# Patient Record
Sex: Female | Born: 1955 | Race: White | Hispanic: No | Marital: Married | Smoking: Former smoker
Health system: Southern US, Community
[De-identification: ages and names within clinical notes are randomized; demographics above are authoritative.]

## PROBLEM LIST (undated history)

## (undated) DIAGNOSIS — D249 Benign neoplasm of unspecified breast: Secondary | ICD-10-CM

## (undated) DIAGNOSIS — Z973 Presence of spectacles and contact lenses: Secondary | ICD-10-CM

## (undated) DIAGNOSIS — K449 Diaphragmatic hernia without obstruction or gangrene: Secondary | ICD-10-CM

## (undated) DIAGNOSIS — Z83719 Family history of colon polyps, unspecified: Secondary | ICD-10-CM

## (undated) DIAGNOSIS — M1990 Unspecified osteoarthritis, unspecified site: Secondary | ICD-10-CM

## (undated) DIAGNOSIS — IMO0002 Reserved for concepts with insufficient information to code with codable children: Secondary | ICD-10-CM

## (undated) DIAGNOSIS — M542 Cervicalgia: Secondary | ICD-10-CM

## (undated) DIAGNOSIS — E611 Iron deficiency: Secondary | ICD-10-CM

## (undated) DIAGNOSIS — Z8371 Family history of colonic polyps: Secondary | ICD-10-CM

## (undated) DIAGNOSIS — K635 Polyp of colon: Secondary | ICD-10-CM

## (undated) DIAGNOSIS — B2790 Infectious mononucleosis, unspecified without complication: Secondary | ICD-10-CM

## (undated) DIAGNOSIS — K828 Other specified diseases of gallbladder: Secondary | ICD-10-CM

## (undated) DIAGNOSIS — K589 Irritable bowel syndrome without diarrhea: Secondary | ICD-10-CM

## (undated) DIAGNOSIS — K7689 Other specified diseases of liver: Secondary | ICD-10-CM

## (undated) DIAGNOSIS — E785 Hyperlipidemia, unspecified: Secondary | ICD-10-CM

## (undated) DIAGNOSIS — G4700 Insomnia, unspecified: Secondary | ICD-10-CM

## (undated) DIAGNOSIS — E041 Nontoxic single thyroid nodule: Secondary | ICD-10-CM

## (undated) HISTORY — DX: Cervicalgia: M54.2

## (undated) HISTORY — DX: Benign neoplasm of unspecified breast: D24.9

## (undated) HISTORY — DX: Irritable bowel syndrome without diarrhea: K58.9

## (undated) HISTORY — DX: Insomnia, unspecified: G47.00

## (undated) HISTORY — DX: Hyperlipidemia, unspecified: E78.5

## (undated) HISTORY — DX: Presence of spectacles and contact lenses: Z97.3

## (undated) HISTORY — DX: Reserved for concepts with insufficient information to code with codable children: IMO0002

## (undated) HISTORY — DX: Nontoxic single thyroid nodule: E04.1

## (undated) HISTORY — DX: Diaphragmatic hernia without obstruction or gangrene: K44.9

## (undated) HISTORY — DX: Other specified diseases of liver: K76.89

## (undated) HISTORY — DX: Family history of colon polyps, unspecified: Z83.719

## (undated) HISTORY — DX: Polyp of colon: K63.5

## (undated) HISTORY — DX: Iron deficiency: E61.1

## (undated) HISTORY — DX: Infectious mononucleosis, unspecified without complication: B27.90

## (undated) HISTORY — DX: Unspecified osteoarthritis, unspecified site: M19.90

## (undated) HISTORY — DX: Family history of colonic polyps: Z83.71

## (undated) HISTORY — DX: Other specified diseases of gallbladder: K82.8

---

## 1974-09-05 HISTORY — PX: OTHER SURGICAL HISTORY: SHX169

## 1998-03-03 ENCOUNTER — Ambulatory Visit (HOSPITAL_COMMUNITY): Admission: RE | Admit: 1998-03-03 | Discharge: 1998-03-03 | Payer: Self-pay | Admitting: Obstetrics and Gynecology

## 1998-08-04 ENCOUNTER — Ambulatory Visit (HOSPITAL_COMMUNITY): Admission: RE | Admit: 1998-08-04 | Discharge: 1998-08-04 | Payer: Self-pay | Admitting: Obstetrics and Gynecology

## 1998-08-04 ENCOUNTER — Encounter: Payer: Self-pay | Admitting: Obstetrics and Gynecology

## 1998-10-12 ENCOUNTER — Other Ambulatory Visit: Admission: RE | Admit: 1998-10-12 | Discharge: 1998-10-12 | Payer: Self-pay | Admitting: Obstetrics and Gynecology

## 1999-08-11 ENCOUNTER — Ambulatory Visit (HOSPITAL_COMMUNITY): Admission: RE | Admit: 1999-08-11 | Discharge: 1999-08-11 | Payer: Self-pay | Admitting: Obstetrics and Gynecology

## 1999-08-11 ENCOUNTER — Encounter: Payer: Self-pay | Admitting: Obstetrics and Gynecology

## 1999-10-15 ENCOUNTER — Other Ambulatory Visit: Admission: RE | Admit: 1999-10-15 | Discharge: 1999-10-15 | Payer: Self-pay | Admitting: Obstetrics and Gynecology

## 2000-08-18 ENCOUNTER — Ambulatory Visit (HOSPITAL_COMMUNITY): Admission: RE | Admit: 2000-08-18 | Discharge: 2000-08-18 | Payer: Self-pay | Admitting: Obstetrics and Gynecology

## 2000-08-18 ENCOUNTER — Encounter: Payer: Self-pay | Admitting: Obstetrics and Gynecology

## 2000-10-27 ENCOUNTER — Other Ambulatory Visit: Admission: RE | Admit: 2000-10-27 | Discharge: 2000-10-27 | Payer: Self-pay | Admitting: Obstetrics and Gynecology

## 2001-08-20 ENCOUNTER — Encounter: Admission: RE | Admit: 2001-08-20 | Discharge: 2001-08-20 | Payer: Self-pay | Admitting: *Deleted

## 2001-08-20 ENCOUNTER — Encounter: Payer: Self-pay | Admitting: *Deleted

## 2001-10-30 ENCOUNTER — Other Ambulatory Visit: Admission: RE | Admit: 2001-10-30 | Discharge: 2001-10-30 | Payer: Self-pay | Admitting: Obstetrics and Gynecology

## 2002-11-13 ENCOUNTER — Other Ambulatory Visit: Admission: RE | Admit: 2002-11-13 | Discharge: 2002-11-13 | Payer: Self-pay | Admitting: Obstetrics and Gynecology

## 2003-06-10 ENCOUNTER — Encounter: Admission: RE | Admit: 2003-06-10 | Discharge: 2003-09-08 | Payer: Self-pay | Admitting: Neurosurgery

## 2003-07-17 ENCOUNTER — Ambulatory Visit (HOSPITAL_COMMUNITY): Admission: RE | Admit: 2003-07-17 | Discharge: 2003-07-17 | Payer: Self-pay | Admitting: *Deleted

## 2003-07-17 ENCOUNTER — Encounter (INDEPENDENT_AMBULATORY_CARE_PROVIDER_SITE_OTHER): Payer: Self-pay | Admitting: Specialist

## 2003-11-06 ENCOUNTER — Ambulatory Visit (HOSPITAL_COMMUNITY): Admission: RE | Admit: 2003-11-06 | Discharge: 2003-11-06 | Payer: Self-pay | Admitting: *Deleted

## 2004-01-16 ENCOUNTER — Other Ambulatory Visit: Admission: RE | Admit: 2004-01-16 | Discharge: 2004-01-16 | Payer: Self-pay | Admitting: Obstetrics and Gynecology

## 2004-01-28 ENCOUNTER — Encounter: Admission: RE | Admit: 2004-01-28 | Discharge: 2004-01-28 | Payer: Self-pay | Admitting: Obstetrics and Gynecology

## 2004-06-09 ENCOUNTER — Ambulatory Visit (HOSPITAL_COMMUNITY): Admission: RE | Admit: 2004-06-09 | Discharge: 2004-06-09 | Payer: Self-pay | Admitting: Family Medicine

## 2004-07-21 ENCOUNTER — Ambulatory Visit (HOSPITAL_COMMUNITY): Admission: RE | Admit: 2004-07-21 | Discharge: 2004-07-21 | Payer: Self-pay | Admitting: *Deleted

## 2005-01-10 ENCOUNTER — Ambulatory Visit: Payer: Self-pay | Admitting: "Endocrinology

## 2005-01-13 ENCOUNTER — Ambulatory Visit (HOSPITAL_COMMUNITY): Admission: RE | Admit: 2005-01-13 | Discharge: 2005-01-13 | Payer: Self-pay | Admitting: "Endocrinology

## 2005-01-19 ENCOUNTER — Other Ambulatory Visit: Admission: RE | Admit: 2005-01-19 | Discharge: 2005-01-19 | Payer: Self-pay | Admitting: Obstetrics and Gynecology

## 2005-02-09 ENCOUNTER — Encounter: Admission: RE | Admit: 2005-02-09 | Discharge: 2005-02-09 | Payer: Self-pay | Admitting: "Endocrinology

## 2005-02-09 ENCOUNTER — Encounter (INDEPENDENT_AMBULATORY_CARE_PROVIDER_SITE_OTHER): Payer: Self-pay | Admitting: *Deleted

## 2005-02-09 ENCOUNTER — Other Ambulatory Visit: Admission: RE | Admit: 2005-02-09 | Discharge: 2005-02-09 | Payer: Self-pay | Admitting: Interventional Radiology

## 2005-10-28 ENCOUNTER — Encounter: Admission: RE | Admit: 2005-10-28 | Discharge: 2005-10-28 | Payer: Self-pay | Admitting: *Deleted

## 2005-11-17 ENCOUNTER — Ambulatory Visit: Payer: Self-pay | Admitting: "Endocrinology

## 2006-02-02 ENCOUNTER — Encounter: Admission: RE | Admit: 2006-02-02 | Discharge: 2006-02-02 | Payer: Self-pay | Admitting: Endocrinology

## 2006-04-10 ENCOUNTER — Encounter (INDEPENDENT_AMBULATORY_CARE_PROVIDER_SITE_OTHER): Payer: Self-pay | Admitting: Specialist

## 2006-04-10 ENCOUNTER — Other Ambulatory Visit: Admission: RE | Admit: 2006-04-10 | Discharge: 2006-04-10 | Payer: Self-pay | Admitting: Interventional Radiology

## 2006-04-10 ENCOUNTER — Encounter: Admission: RE | Admit: 2006-04-10 | Discharge: 2006-04-10 | Payer: Self-pay | Admitting: Surgery

## 2007-03-20 ENCOUNTER — Ambulatory Visit (HOSPITAL_COMMUNITY): Admission: RE | Admit: 2007-03-20 | Discharge: 2007-03-20 | Payer: Self-pay | Admitting: Obstetrics and Gynecology

## 2008-09-05 HISTORY — PX: ENDOMETRIAL ABLATION: SHX621

## 2008-09-20 ENCOUNTER — Emergency Department (HOSPITAL_BASED_OUTPATIENT_CLINIC_OR_DEPARTMENT_OTHER): Admission: EM | Admit: 2008-09-20 | Discharge: 2008-09-20 | Payer: Self-pay | Admitting: Emergency Medicine

## 2008-09-26 ENCOUNTER — Encounter (INDEPENDENT_AMBULATORY_CARE_PROVIDER_SITE_OTHER): Payer: Self-pay | Admitting: Obstetrics and Gynecology

## 2008-09-26 ENCOUNTER — Ambulatory Visit (HOSPITAL_COMMUNITY): Admission: RE | Admit: 2008-09-26 | Discharge: 2008-09-26 | Payer: Self-pay | Admitting: Obstetrics and Gynecology

## 2009-09-22 ENCOUNTER — Encounter: Admission: RE | Admit: 2009-09-22 | Discharge: 2009-09-22 | Payer: Self-pay | Admitting: Neurosurgery

## 2009-11-03 ENCOUNTER — Encounter: Admission: RE | Admit: 2009-11-03 | Discharge: 2009-12-11 | Payer: Self-pay | Admitting: Neurology

## 2010-05-07 LAB — HM DEXA SCAN

## 2010-08-17 ENCOUNTER — Encounter
Admission: RE | Admit: 2010-08-17 | Discharge: 2010-08-17 | Payer: Self-pay | Source: Home / Self Care | Attending: Gastroenterology | Admitting: Gastroenterology

## 2010-09-03 ENCOUNTER — Ambulatory Visit (HOSPITAL_COMMUNITY)
Admission: RE | Admit: 2010-09-03 | Discharge: 2010-09-03 | Payer: Self-pay | Source: Home / Self Care | Attending: Gastroenterology | Admitting: Gastroenterology

## 2010-09-26 ENCOUNTER — Encounter: Payer: Self-pay | Admitting: *Deleted

## 2010-12-20 LAB — CBC
Hemoglobin: 11.8 g/dL — ABNORMAL LOW (ref 12.0–15.0)
RBC: 3.98 MIL/uL (ref 3.87–5.11)
WBC: 8.4 10*3/uL (ref 4.0–10.5)

## 2011-01-14 ENCOUNTER — Encounter (INDEPENDENT_AMBULATORY_CARE_PROVIDER_SITE_OTHER): Payer: Self-pay | Admitting: Surgery

## 2011-01-18 NOTE — Op Note (Signed)
NAMEKEYONDRA, LAGRAND               ACCOUNT NO.:  [id number]   MEDICAL RECORD NO.:  [id number]          PATIENT TYPE:  AMB   LOCATION:  SDC                           FACILITY:  [facility]   PHYSICIAN:  Duke Salvia. Marcelle Overlie, M.D.DATE OF BIRTH:  21-Jul-1956   DATE OF PROCEDURE:  DATE OF DISCHARGE:  09/20/2008                               OPERATIVE REPORT   PREOPERATIVE DIAGNOSIS:  Abnormal uterine bleeding.   POSTOPERATIVE DIAGNOSIS:  Abnormal uterine bleeding.   PROCEDURE:  D and C, hysteroscopy, NovaSure EMA.   SURGEON:  Duke Salvia. Marcelle Overlie, MD   ANESTHESIA:  Sedation plus paracervical block.   SPECIMENS REMOVED:  Endometrial curettings.   COMPLICATIONS:  None.   ESTIMATED BLOOD LOSS:  Minimal.   PROCEDURE AND FINDINGS:  The patient was taken to the operative room  after an adequate level of sedation was obtained with the patient's legs  in stirrups.  Perineum and vagina were prepped and draped in the usual  manner for D and C.  The bladder was drained.  EUA carried out.  Uterus  upper limits of normal size, mid position, adnexa negative.  A weighted  speculum was positioned.  Cervix grasped with a tenaculum.  Paracervical  block created by infiltrating at 2, 10, 8, 4 o'clock, 5-7 mL of 1%  Xylocaine at each site after negative aspiration.  The uterus sounded to  10 cm, progressively dilated to a 32 Pratt dilator.  Uterus sounded to  10.5 cm.  Cervical length 3.5 cm.  A 7-mm continuous flow hysteroscope  was then used to perform hysteroscopy revealing there to be some shaggy  endometrium, but no polyps, no other abnormalities noted.  Sharp  curettage was carried out and sent for pathology.  The NovaSure  instrument was then engaged with a 6.5 depth setting and the width  measurement of 4.5.  Initially, could not get the CO2 test to pass even  with a good seal in cervix.  Instrument was removed.  Hysteroscopy was  carried out.  There was no evidence of any bleeding or concern for  perforation.  At that time, it was discovered that the lower lock vacuum  on the lower end of the instrument was not sealed appropriately and some  bloody fluid had actually suctioned into the device itself indicating an  improper vacuum seal.  That instrument was removed and new instrument  was utilized with the appropriate settings.  We checked the vacuum  settings on that this instrument prior to remeasuring the cervical width  which was still 6.5.  At that point, the CO2 test was passed quickly.  Endometrial  ablation was then carried out and was well tolerated for power setting  of 143.  The time was not listed by the circulator.  This was well  tolerated with minimal bleeding.  She went to recovery room in good  condition.  Did receive Toradol both IV and IM.      Richard M. Marcelle Overlie, M.D.  Electronically Signed     RMH/MEDQ  D:  09/26/2008  T:  09/27/2008  Job:  [id number]

## 2011-01-18 NOTE — H&P (Signed)
[name], [name]               ACCOUNT NO.:  [id number]   MEDICAL RECORD NO.:  [id number]          PATIENT TYPE:  AMB   LOCATION:                                FACILITY:  [facility]   PHYSICIAN:  Duke Salvia. Marcelle [name], M.D.DATE OF BIRTH:  1955-10-26   DATE OF ADMISSION:  09/26/2008  DATE OF DISCHARGE:                              HISTORY & PHYSICAL   DATE OF SURGERY:  September 11, 2008.   CHIEF COMPLAINT:  AUB.   HISTORY OF PRESENT ILLNESS:  A 55 year old perimenopausal female.  Due  to irregular bleeding in December 2008, she had Crystal [name] and endometrial  biopsy.  Endometrial biopsy was normal.  Ultrasound showed possible  adenomyosis.  Due to continued irregular bleeding, she has been started  on OCPs with variable results, presents now for D and C, hysteroscopy,  and NovaSure EMA due to continued problematic bleeding.  This procedure  including risks related to bleeding, infection, adjacent organ injury,  the possible need for open additional surgery, all reviewed with her  which she understands and accepts.   CURRENT MEDICATIONS:  1. Femcon b.i.d.  2. Simvastatin 20 mg once daily.  3. Lorazepam 0.5 p.r.n.   PAST SURGICAL HISTORY:  Cesarean section x2.   FAMILY HISTORY:  Significant for kidney disease, arthritis, diabetes,  hypertension, and colon cancer.   Her medical doctor is Crystal Martin, MD   PHYSICAL EXAMINATION:  VITAL SIGNS:  Temperature 98.2, blood pressure  120/82.  HEENT:  Unremarkable.  NECK:  Supple without masses.  LUNGS:  Clear.  CARDIOVASCULAR:  Regular rate and rhythm without murmurs, rubs, or  gallops.  BREASTS:  Without masses.  ABDOMEN:  Soft, flat, nontender.  PELVIC:  Normal external genitalia.  High vaginal swab is clear.  Uterus  upper limit of normal size.  Adnexa negative.  EXTREMITIES:  Unremarkable.  NEUROLOGIC:  Unremarkable.   IMPRESSION:  1. Abnormal perimenopausal bleeding, unresponsive to conservative      measures.  2.  Adenomyosis.   PLAN:  D and C, hysteroscopy with NovaSure EMA.  Procedure and risks  reviewed as above up.      Crystal [name], M.D.  Electronically Signed     RMH/MEDQ  D:  09/08/2008  T:  09/09/2008  Job:  [id number]

## 2011-03-16 ENCOUNTER — Other Ambulatory Visit (INDEPENDENT_AMBULATORY_CARE_PROVIDER_SITE_OTHER): Payer: Self-pay | Admitting: Surgery

## 2011-03-16 DIAGNOSIS — E041 Nontoxic single thyroid nodule: Secondary | ICD-10-CM

## 2011-03-25 ENCOUNTER — Ambulatory Visit
Admission: RE | Admit: 2011-03-25 | Discharge: 2011-03-25 | Disposition: A | Discharge status: Home / Self Care | Payer: 59 | Source: Ambulatory Visit | Attending: Surgery | Admitting: Surgery

## 2011-03-25 DIAGNOSIS — E041 Nontoxic single thyroid nodule: Secondary | ICD-10-CM

## 2011-05-02 ENCOUNTER — Encounter (INDEPENDENT_AMBULATORY_CARE_PROVIDER_SITE_OTHER): Payer: Self-pay | Admitting: Surgery

## 2011-05-03 ENCOUNTER — Encounter (INDEPENDENT_AMBULATORY_CARE_PROVIDER_SITE_OTHER): Payer: Self-pay | Admitting: Surgery

## 2011-05-03 ENCOUNTER — Ambulatory Visit (INDEPENDENT_AMBULATORY_CARE_PROVIDER_SITE_OTHER): Payer: 59 | Admitting: Surgery

## 2011-05-03 VITALS — BP 100/70 | Temp 97.6°F | Ht 63.0 in | Wt 138.0 lb

## 2011-05-03 DIAGNOSIS — E041 Nontoxic single thyroid nodule: Secondary | ICD-10-CM | POA: Insufficient documentation

## 2011-05-03 NOTE — Progress Notes (Signed)
Visit Diagnoses: 1. Benign thyroid cyst     HISTORY: The patient is a 55 year old female followed in my practice for several years with a multi-septated cyst involving the lower pole of the right thyroid lobe. This is been asymptomatic. He has been stable on sequential ultrasound scanning. Ultrasound was performed this month at [facility]. It shows minimal change in the character and size of the cyst since her examination in 2007. Remainder of the thyroid gland is without focal lesion.   PERTINENT REVIEW OF SYSTEMS: Patient continues to note a slightly garbled quality to her voice. This has been stable over many years. She denies any compressive symptoms. She denies a globus sensation. She denies any dysphagia.   EXAM: HEENT: normocephalic; pupils equal and reactive; sclerae clear; dentition good; mucous membranes moist NECK:  symmetric on extension; no palpable anterior or posterior cervical lymphadenopathy; no supraclavicular masses; no tenderness CHEST: clear to auscultation bilaterally without rales, rhonchi, or wheezes CARDIAC: regular rate and rhythm without significant murmur; peripheral pulses are full EXT:  non-tender without edema; no deformity NEURO: no gross focal deficits; no sign of tremor   IMPRESSION: 3.9 cm septated cyst in the inferior pole right thyroid lobe, asymptomatic   PLAN: The patient and I discussed the above findings. At this point she is asymptomatic. The overall size and character of the lesion is stable over 5 years. I think it is safe to continue to monitor this. We will repeat her ultrasound in 2 years and see her in the office at that time.  We will obtain her recent TSH level from the office of her gynecologist.   Velora Heckler, MD, FACS General & Endocrine Surgery [facility]

## 2011-05-08 LAB — HM COLONOSCOPY

## 2011-09-28 ENCOUNTER — Encounter: Payer: Self-pay | Admitting: Internal Medicine

## 2011-09-30 ENCOUNTER — Encounter: Payer: Self-pay | Admitting: Internal Medicine

## 2011-10-06 ENCOUNTER — Ambulatory Visit: Payer: 59 | Admitting: Internal Medicine

## 2011-11-16 ENCOUNTER — Encounter (INDEPENDENT_AMBULATORY_CARE_PROVIDER_SITE_OTHER): Payer: 59 | Admitting: Surgery

## 2011-11-18 ENCOUNTER — Encounter (INDEPENDENT_AMBULATORY_CARE_PROVIDER_SITE_OTHER): Payer: Self-pay | Admitting: Surgery

## 2011-11-18 ENCOUNTER — Telehealth (INDEPENDENT_AMBULATORY_CARE_PROVIDER_SITE_OTHER): Payer: Self-pay | Admitting: Surgery

## 2011-11-18 ENCOUNTER — Ambulatory Visit (INDEPENDENT_AMBULATORY_CARE_PROVIDER_SITE_OTHER): Payer: 59 | Admitting: Surgery

## 2011-11-18 VITALS — BP 104/76 | HR 60 | Temp 98.6°F | Resp 16 | Ht 63.5 in | Wt 141.0 lb

## 2011-11-18 DIAGNOSIS — K828 Other specified diseases of gallbladder: Secondary | ICD-10-CM | POA: Insufficient documentation

## 2011-11-18 NOTE — Patient Instructions (Signed)
[facility] LAPAROSCOPIC SURGERY: POST OP INSTRUCTIONS  Always review your discharge instruction sheet given to you by the facility where your surgery was performed.  1. A prescription for pain medication may be given to you upon discharge.  Take your pain medication as prescribed, if needed.  If narcotic pain medicine is not needed, then you may take acetaminophen (Tylenol) or ibuprofen (Advil) as needed. 2. Take your usually prescribed medications unless otherwise directed. 3. If you need a refill on your pain medication, please contact your pharmacy.  They will contact our office to request authorization. Prescriptions will not be filled after 5pm or on week-ends. 4. You should follow a light diet the first few days after arrival home, such as soup and crackers, etc.  Be sure to include lots of fluids daily. 5. Most patients will experience some swelling and bruising in the area of the incisions.  Ice packs will help.  Swelling and bruising can take several days to resolve.  6. It is common to experience some constipation if taking pain medication after surgery.  Increasing fluid intake and taking a stool softener (such as Colace) will usually help or prevent this problem from occurring.  A mild laxative (Milk of Magnesia or Miralax) should be taken according to package instructions if there are no bowel movements after 48 hours. 7. Unless discharge instructions indicate otherwise, you may remove your bandages 24-48 hours after surgery, and you may shower at that time.  You may have steri-strips (small skin tapes) in place directly over the incision.  These strips should be left on the skin for 7-10 days.  If your surgeon used skin glue on the incision, you may shower in 24 hours.  The glue will flake off over the next 2-3 weeks.  Any sutures or staples will be removed at the office during your follow-up visit. 8. ACTIVITIES:  You may resume regular (light) daily activities  beginning the next day-such as daily self-care, walking, climbing stairs-gradually increasing activities as tolerated.  You may have sexual intercourse when it is comfortable.  Refrain from any heavy lifting or straining until approved by your doctor. 9. You may drive when you are no longer taking prescription pain medication, you can comfortably wear a seatbelt, and you can safely maneuver your car and apply brakes. 10. You should see your doctor in the office for a follow-up appointment approximately 2-3 weeks after your surgery.  Make sure that you call for this appointment within a day or two after you arrive home to insure a convenient appointment time.  WHEN TO CALL YOUR DOCTOR: 1. Fever over 101.0 2. Inability to urinate 3. Continued bleeding from incision. 4. Increased pain, redness, or drainage from the incision. 5. Increasing abdominal pain  The clinic staff is available to answer your questions during regular business hours.  Please don't hesitate to call and ask to speak to one of the nurses for clinical concerns.  If you have a medical emergency, go to the nearest emergency room or call 911.  A surgeon from [facility] is always on call at the hospital. [phone] ? [phone] ? FAX [phone] Web site: [email or web address]  

## 2011-11-18 NOTE — Progress Notes (Signed)
Chief Complaint  Patient presents with  . Abdominal Pain    history of biliary dyskinesia - referral by Dr. Marisue Brooklyn    HISTORY: Patient is a 56 year old white female well-known to my surgical practice. Patient has been previously evaluated for abdominal pain and history of biliary dyskinesia. Patient has also been followed for a thyroid cyst.  Patient has undergone additional testing including colonoscopy, upper endoscopy, and recent laboratory studies. She continues to have right upper quadrant abdominal discomfort radiating to the back. This is exacerbated by food intake. She denies fevers or chills. She denies nausea or vomiting. She does have loose bowel movements. She has a history of biliary dyskinesia with a low gallbladder ejection fraction on nuclear medicine scanning. Patient presents today to again discuss cholecystectomy.  Past Medical History  Diagnosis Date  . IBS (irritable bowel syndrome)   . Colon polyp   . Hyperlipidemia   . Wears glasses   . Abdominal pain   . Cyst     right  thyroid  . Insomnia   . Neck pain   . Hemorrhoids   . Mononucleosis   . Migraine   . Thyroid nodule   . Iron deficiency   . Arthritis   . Liver cyst   . FH: colonic polyps   . Hiatal hernia   . Biliary dyskinesia   . Fibroadenoma      Current Outpatient Prescriptions  Medication Sig Dispense Refill  . LORazepam (ATIVAN) 0.5 MG tablet Take 0.5 mg by mouth every 8 (eight) hours.           No Known Allergies   Family History  Problem Relation Age of Onset  . Hypertension Mother   . Kidney disease Mother   . Hypertension Father   . Cancer Father     colon  . Diabetes Father      History   Social History  . Marital Status: Married    Spouse Name: N/A    Number of Children: N/A  . Years of Education: N/A   Social History Main Topics  . Smoking status: Former Games developer  . Smokeless tobacco: None   Comment: 8yrs ago quit  . Alcohol Use: No  . Drug Use: No  .  Sexually Active: None   Other Topics Concern  . None   Social History Narrative  . None     REVIEW OF SYSTEMS - PERTINENT POSITIVES ONLY: Denies jaundice. Denies acholic stools. Denies fever. Denies chills. Denies nausea. Denies emesis.  EXAM: Filed Vitals:   11/18/11 0913  BP: 104/76  Pulse: 60  Temp: 98.6 F (37 C)  Resp: 16    HEENT: normocephalic; pupils equal and reactive; sclerae clear; dentition good; mucous membranes moist NECK:  symmetric on extension; no palpable anterior or posterior cervical lymphadenopathy; no supraclavicular masses; no tenderness CHEST: clear to auscultation bilaterally without rales, rhonchi, or wheezes CARDIAC: regular rate and rhythm without significant murmur; peripheral pulses are full ABDOMEN: soft without distension; bowel sounds present; no mass; no hepatosplenomegaly; no hernia EXT:  non-tender without edema; no deformity NEURO: no gross focal deficits; no sign of tremor   LABORATORY RESULTS: See [facility] (CHL-Epic) for most recent results   RADIOLOGY RESULTS: See [facility] (CHL-Epic) for most recent results   IMPRESSION: Persistent right upper quadrant abdominal pain with radiation to the back, history of borderline biliary dyskinesia  PLAN: The patient and I again discussed cholecystectomy in hopes of improving her right upper quadrant abdominal symptoms. She understands  that there is no guarantee of relief. We discussed the procedure of laparoscopic cholecystectomy. We discussed the possibility of conversion to open surgery. We discussed the hospital stay in the postoperative recovery period.  We will perform intraoperative cholangiography to evaluate the bile ducts. Patient is anxious to proceed with surgery in the near future.  The risks and benefits of the procedure have been discussed at length with the patient.  The patient understands the proposed procedure, potential alternative treatments, and the course of  recovery to be expected.  All of the patient's questions have been answered at this time.  The patient wishes to proceed with surgery and will schedule a date for the procedure through our office staff.   Velora Heckler, MD, FACS General & Endocrine Surgery [facility]   Visit Diagnoses: 1. Biliary dyskinesia     Primary Care Physician: Doreatha Martin, MD, MD  GI:  Dr. Marisue Brooklyn

## 2011-11-25 ENCOUNTER — Other Ambulatory Visit (INDEPENDENT_AMBULATORY_CARE_PROVIDER_SITE_OTHER): Payer: Self-pay | Admitting: Surgery

## 2011-11-25 DIAGNOSIS — K811 Chronic cholecystitis: Secondary | ICD-10-CM

## 2011-12-01 HISTORY — PX: CHOLECYSTECTOMY: SHX55

## 2011-12-09 ENCOUNTER — Encounter (INDEPENDENT_AMBULATORY_CARE_PROVIDER_SITE_OTHER): Payer: Self-pay | Admitting: Surgery

## 2011-12-16 ENCOUNTER — Ambulatory Visit (INDEPENDENT_AMBULATORY_CARE_PROVIDER_SITE_OTHER): Payer: 59 | Admitting: Surgery

## 2011-12-22 ENCOUNTER — Encounter (INDEPENDENT_AMBULATORY_CARE_PROVIDER_SITE_OTHER): Payer: Self-pay | Admitting: Surgery

## 2011-12-22 ENCOUNTER — Encounter (INDEPENDENT_AMBULATORY_CARE_PROVIDER_SITE_OTHER): Payer: Self-pay

## 2011-12-22 ENCOUNTER — Ambulatory Visit (INDEPENDENT_AMBULATORY_CARE_PROVIDER_SITE_OTHER): Payer: 59 | Admitting: Surgery

## 2011-12-22 VITALS — BP 106/70 | HR 68 | Temp 98.2°F | Resp 16 | Ht 63.5 in | Wt 134.6 lb

## 2011-12-22 DIAGNOSIS — K828 Other specified diseases of gallbladder: Secondary | ICD-10-CM

## 2011-12-22 NOTE — Patient Instructions (Signed)
  COCOA BUTTER & VITAMIN E CREAM  ([employer] or other brand)  Apply cocoa butter/vitamin E cream to your incision 2 - 3 times daily.  Massage cream into incision for one minute with each application.  Use sunscreen (50 SPF or higher) for first 6 months after surgery if area is exposed to sun.  You may substitute Mederma or other scar reducing creams as desired.   

## 2011-12-22 NOTE — Progress Notes (Signed)
Visit Diagnoses: 1. Biliary dyskinesia     HISTORY: The patient is a 56 year old white female who underwent laparoscopic cholecystectomy for biliary dyskinesia. Final pathology shows chronic cholecystitis.  EXAM: Abdomen is soft and nontender. Surgical wounds are well healed. No sign of herniation. No sign of infection. Right upper quadrant is soft and nontender without mass.  IMPRESSION: Status post laparoscopic cholecystectomy for biliary dyskinesia  PLAN: Patient is released to full activity without restriction. She will apply topical creams or incisions. I have given her a copy of her pathology report.  Patient will return for followup in 2 years regarding her thyroid cyst.  Velora Heckler, MD, FACS General & Endocrine Surgery [facility]

## 2012-02-03 ENCOUNTER — Other Ambulatory Visit (HOSPITAL_COMMUNITY): Payer: Self-pay | Admitting: Obstetrics and Gynecology

## 2012-02-03 DIAGNOSIS — R1011 Right upper quadrant pain: Secondary | ICD-10-CM

## 2012-02-03 DIAGNOSIS — R109 Unspecified abdominal pain: Secondary | ICD-10-CM

## 2012-02-07 ENCOUNTER — Ambulatory Visit (HOSPITAL_COMMUNITY): Admission: RE | Admit: 2012-02-07 | Payer: 59 | Source: Ambulatory Visit

## 2012-02-08 ENCOUNTER — Ambulatory Visit (HOSPITAL_COMMUNITY)
Admission: RE | Admit: 2012-02-08 | Discharge: 2012-02-08 | Disposition: A | Discharge status: Home / Self Care | Payer: 59 | Source: Ambulatory Visit | Attending: Obstetrics and Gynecology | Admitting: Obstetrics and Gynecology

## 2012-02-08 DIAGNOSIS — R109 Unspecified abdominal pain: Secondary | ICD-10-CM

## 2012-02-08 DIAGNOSIS — D1809 Hemangioma of other sites: Secondary | ICD-10-CM | POA: Insufficient documentation

## 2012-02-08 DIAGNOSIS — R1011 Right upper quadrant pain: Secondary | ICD-10-CM

## 2012-02-08 DIAGNOSIS — K7689 Other specified diseases of liver: Secondary | ICD-10-CM | POA: Insufficient documentation

## 2012-02-08 MED ORDER — IOHEXOL 300 MG/ML  SOLN: 100.0000 mL | Freq: Once | INTRAMUSCULAR | Status: AC | PRN

## 2012-03-22 ENCOUNTER — Encounter (INDEPENDENT_AMBULATORY_CARE_PROVIDER_SITE_OTHER): Payer: Self-pay

## 2012-10-16 ENCOUNTER — Ambulatory Visit (INDEPENDENT_AMBULATORY_CARE_PROVIDER_SITE_OTHER): Payer: 59 | Admitting: Surgery

## 2012-10-16 ENCOUNTER — Encounter (INDEPENDENT_AMBULATORY_CARE_PROVIDER_SITE_OTHER): Payer: Self-pay | Admitting: Surgery

## 2012-10-16 VITALS — BP 112/78 | HR 63 | Temp 97.5°F | Resp 18 | Ht 63.5 in | Wt 143.6 lb

## 2012-10-16 DIAGNOSIS — E041 Nontoxic single thyroid nodule: Secondary | ICD-10-CM

## 2012-10-16 DIAGNOSIS — Z8639 Personal history of other endocrine, nutritional and metabolic disease: Secondary | ICD-10-CM

## 2012-10-16 DIAGNOSIS — Z862 Personal history of diseases of the blood and blood-forming organs and certain disorders involving the immune mechanism: Secondary | ICD-10-CM

## 2012-10-16 NOTE — Progress Notes (Signed)
General Surgery [facility]  Visit Diagnoses: Thyroid cyst  HISTORY: Patient is a 57 year old white female followed for several years in my practice for benign thyroid cyst. This has not been symptomatic. She has had no recent diagnostic studies. Patient is currently undergoing evaluation for SVT by cardiology. She has a cardiac echo scheduled for later this week. She wondered if the thyroid cyst would have any role in her new diagnosis.  PERTINENT REVIEW OF SYSTEMS: Denies tremor. Recent palpitations related to SVT. No compressive symptoms.  EXAM: HEENT: normocephalic; pupils equal and reactive; sclerae clear; dentition good; mucous membranes moist NECK:  Palpation of the thyroid reveals no dominant or discrete masses. Fullness in the lower right lobe extends beneath the right clavicle; symmetric on extension; no palpable anterior or posterior cervical lymphadenopathy; no supraclavicular masses; no tenderness CHEST: clear to auscultation bilaterally without rales, rhonchi, or wheezes CARDIAC: regular rate and rhythm without significant murmur; peripheral pulses are full EXT:  non-tender without edema; no deformity NEURO: no gross focal deficits; no sign of tremor   IMPRESSION: #1 history of benign right thyroid cyst #2 recent supraventricular tachycardia under evaluation #3 subjective change in voice quality  PLAN: The patient and I discussed the above findings at length. I will obtain a thyroid ultrasound and compare it to her previous studies to see if there has been any significant change in her thyroid cyst. We will obtain the TSH level from her primary care physician's office for review.  Patient and I have discussed possible evaluation of her force quality by an EENT specialist. This would require direct laryngoscopy. Patient would like to consider this further and may pursue further evaluation at some point in the future.  I will contact the patient with the  results of her thyroid ultrasound.  Velora Heckler, MD, [facility] Office: [phone]

## 2012-10-16 NOTE — Patient Instructions (Signed)
Thyroid Cyst The thyroid gland is a butterfly-shaped gland in the middle of the neck, located just below the voice box. It makes thyroid hormone. Thyroid hormone has an effect on nearly all tissues of your body by regulating your metabolism. Metabolism is the breakdown and use of food that you eat or energy that is stored in your body. Your metabolism affects your heart rate, blood pressure, body temperature, and weight. Thyroid cysts are enlarged fluid filled regions of the thyroid gland. These cysts range in size and may expand and enlarge suddenly. Rapidly expanding cysts may cause pain, difficulty swallowing, and rarely, difficulty breathing. Most cysts of the thyroid are not cancerous (benign). SYMPTOMS Bleeding may occur within the cyst. If the bleeding is severe, the cyst may get larger and produce problems in the neck, including swelling that may produce pain and difficultly swallowing. If the vocal cords are compressed, hoarseness may occur. If the windpipe is compressed, you may have difficulty breathing. DIAGNOSIS  A thyroid cyst is diagnosed through physical exam. The diagnosis can be confirmed by an ultrasound exam of the neck. This creates a picture by bouncing sound waves off the thyroid gland. Sometimes the cysts are drained using a fine needle. The fluid is then sent to the lab where it can be examined. This is done to see if any cells in the fluid are cancerous. If they are found to be cancerous, you will need further treatment.  TREATMENT  If the fluid in your neck does not show evidence of cancer, your caregiver may just want to monitor you with yearly ultrasound exams. Sometimes cysts need to be removed surgically. Document Released: 07/15/2004 Document Revised: 11/14/2011 Document Reviewed: 10/28/2010 [employer] Patient Information 2013 [employer].  

## 2012-10-19 ENCOUNTER — Other Ambulatory Visit: Payer: 59

## 2012-10-25 ENCOUNTER — Ambulatory Visit
Admission: RE | Admit: 2012-10-25 | Discharge: 2012-10-25 | Disposition: A | Discharge status: Home / Self Care | Payer: 59 | Source: Ambulatory Visit | Attending: Surgery | Admitting: Surgery

## 2012-10-25 DIAGNOSIS — Z8639 Personal history of other endocrine, nutritional and metabolic disease: Secondary | ICD-10-CM

## 2012-11-22 ENCOUNTER — Telehealth (INDEPENDENT_AMBULATORY_CARE_PROVIDER_SITE_OTHER): Payer: Self-pay

## 2012-11-22 ENCOUNTER — Other Ambulatory Visit (INDEPENDENT_AMBULATORY_CARE_PROVIDER_SITE_OTHER): Payer: Self-pay

## 2012-11-22 DIAGNOSIS — E042 Nontoxic multinodular goiter: Secondary | ICD-10-CM

## 2012-11-22 NOTE — Telephone Encounter (Signed)
Pt notified of u/s result per Dr Ardine Eng request and advised to plan on repeat u/s Feb 2015.

## 2012-11-22 NOTE — Telephone Encounter (Signed)
Message copied by Joanette Gula on Thu Nov 22, 2012  4:31 PM ------      Message from: Velora Heckler      Created: Thu Nov 22, 2012  3:29 PM       Cindy:            Please let Crystal [name] know that cyst is only slightly smaller on her last USN study.  We will continue to follow.  No big changes.            tmg                  ----- Message -----         From: Rad Results In Interface         Sent: 10/25/2012   4:06 PM           To: Velora Heckler, MD                   ------

## 2012-12-05 ENCOUNTER — Encounter (INDEPENDENT_AMBULATORY_CARE_PROVIDER_SITE_OTHER): Payer: Self-pay

## 2013-05-07 LAB — HM MAMMOGRAPHY: HM MAMMO: NORMAL

## 2013-05-07 LAB — HM PAP SMEAR: HM PAP: NORMAL

## 2013-09-25 ENCOUNTER — Other Ambulatory Visit: Payer: Self-pay | Admitting: Obstetrics and Gynecology

## 2013-09-25 DIAGNOSIS — N6009 Solitary cyst of unspecified breast: Secondary | ICD-10-CM

## 2013-10-03 ENCOUNTER — Ambulatory Visit
Admission: RE | Admit: 2013-10-03 | Discharge: 2013-10-03 | Disposition: A | Discharge status: Home / Self Care | Payer: BC Managed Care – PPO | Source: Ambulatory Visit | Attending: Obstetrics and Gynecology | Admitting: Obstetrics and Gynecology

## 2013-10-03 DIAGNOSIS — N6009 Solitary cyst of unspecified breast: Secondary | ICD-10-CM

## 2013-10-19 ENCOUNTER — Emergency Department (HOSPITAL_COMMUNITY)
Admission: EM | Admit: 2013-10-19 | Discharge: 2013-10-19 | Disposition: A | Discharge status: Home / Self Care | Payer: BC Managed Care – PPO | Attending: Emergency Medicine | Admitting: Emergency Medicine

## 2013-10-19 ENCOUNTER — Emergency Department (HOSPITAL_COMMUNITY): Payer: BC Managed Care – PPO

## 2013-10-19 ENCOUNTER — Encounter (HOSPITAL_COMMUNITY): Payer: Self-pay | Admitting: Emergency Medicine

## 2013-10-19 DIAGNOSIS — Z8742 Personal history of other diseases of the female genital tract: Secondary | ICD-10-CM | POA: Insufficient documentation

## 2013-10-19 DIAGNOSIS — Z8639 Personal history of other endocrine, nutritional and metabolic disease: Secondary | ICD-10-CM | POA: Insufficient documentation

## 2013-10-19 DIAGNOSIS — Z8619 Personal history of other infectious and parasitic diseases: Secondary | ICD-10-CM | POA: Insufficient documentation

## 2013-10-19 DIAGNOSIS — Z8719 Personal history of other diseases of the digestive system: Secondary | ICD-10-CM | POA: Insufficient documentation

## 2013-10-19 DIAGNOSIS — R55 Syncope and collapse: Secondary | ICD-10-CM

## 2013-10-19 DIAGNOSIS — Z8679 Personal history of other diseases of the circulatory system: Secondary | ICD-10-CM | POA: Insufficient documentation

## 2013-10-19 DIAGNOSIS — M129 Arthropathy, unspecified: Secondary | ICD-10-CM | POA: Insufficient documentation

## 2013-10-19 DIAGNOSIS — Z87891 Personal history of nicotine dependence: Secondary | ICD-10-CM | POA: Insufficient documentation

## 2013-10-19 DIAGNOSIS — Z862 Personal history of diseases of the blood and blood-forming organs and certain disorders involving the immune mechanism: Secondary | ICD-10-CM | POA: Insufficient documentation

## 2013-10-19 DIAGNOSIS — Z860100 Personal history of colon polyps, unspecified: Secondary | ICD-10-CM | POA: Insufficient documentation

## 2013-10-19 DIAGNOSIS — Z79899 Other long term (current) drug therapy: Secondary | ICD-10-CM | POA: Insufficient documentation

## 2013-10-19 LAB — POCT I-STAT TROPONIN I
Troponin i, poc: 0 ng/mL (ref 0.00–0.08)
Troponin i, poc: 0 ng/mL (ref 0.00–0.08)

## 2013-10-19 LAB — CBC WITH DIFFERENTIAL/PLATELET
BASOS ABS: 0 10*3/uL (ref 0.0–0.1)
Basophils Relative: 0 % (ref 0–1)
Eosinophils Absolute: 0.1 10*3/uL (ref 0.0–0.7)
Eosinophils Relative: 1 % (ref 0–5)
HCT: 40.9 % (ref 36.0–46.0)
Hemoglobin: 14 g/dL (ref 12.0–15.0)
LYMPHS PCT: 15 % (ref 12–46)
Lymphs Abs: 1.8 10*3/uL (ref 0.7–4.0)
MCH: 30.4 pg (ref 26.0–34.0)
MCHC: 34.2 g/dL (ref 30.0–36.0)
MCV: 88.9 fL (ref 78.0–100.0)
Monocytes Absolute: 0.7 10*3/uL (ref 0.1–1.0)
Monocytes Relative: 6 % (ref 3–12)
NEUTROS ABS: 8.9 10*3/uL — AB (ref 1.7–7.7)
Neutrophils Relative %: 78 % — ABNORMAL HIGH (ref 43–77)
Platelets: 276 10*3/uL (ref 150–400)
RBC: 4.6 MIL/uL (ref 3.87–5.11)
RDW: 12.6 % (ref 11.5–15.5)
WBC: 11.4 10*3/uL — AB (ref 4.0–10.5)

## 2013-10-19 LAB — COMPREHENSIVE METABOLIC PANEL
ALK PHOS: 72 U/L (ref 39–117)
ALT: 19 U/L (ref 0–35)
AST: 17 U/L (ref 0–37)
Albumin: 3.8 g/dL (ref 3.5–5.2)
BILIRUBIN TOTAL: 0.7 mg/dL (ref 0.3–1.2)
BUN: 15 mg/dL (ref 6–23)
CO2: 23 meq/L (ref 19–32)
Calcium: 9.7 mg/dL (ref 8.4–10.5)
Chloride: 102 mEq/L (ref 96–112)
Creatinine, Ser: 0.64 mg/dL (ref 0.50–1.10)
GFR calc Af Amer: >90 mL/min (ref >90)
Glucose, Bld: 113 mg/dL — ABNORMAL HIGH (ref 70–99)
POTASSIUM: 3.3 meq/L — AB (ref 3.7–5.3)
SODIUM: 140 meq/L (ref 137–147)
Total Protein: 7.4 g/dL (ref 6.0–8.3)

## 2013-10-19 LAB — URINALYSIS, ROUTINE W REFLEX MICROSCOPIC
BILIRUBIN URINE: NEGATIVE
Glucose, UA: NEGATIVE mg/dL
HGB URINE DIPSTICK: NEGATIVE
KETONES UR: NEGATIVE mg/dL
Leukocytes, UA: NEGATIVE
Nitrite: NEGATIVE
PROTEIN: NEGATIVE mg/dL
Specific Gravity, Urine: 1.011 (ref 1.005–1.030)
UROBILINOGEN UA: 0.2 mg/dL (ref 0.0–1.0)
pH: 6 (ref 5.0–8.0)

## 2013-10-19 MED ORDER — SODIUM CHLORIDE 0.9 % IV SOLN
1000.0000 mL | Freq: Once | INTRAVENOUS | Status: AC
  Administered 2013-10-19: 1000 mL via INTRAVENOUS

## 2013-10-19 MED ORDER — SODIUM CHLORIDE 0.9 % IV SOLN
1000.0000 mL | INTRAVENOUS | Status: DC
  Administered 2013-10-19: 1000 mL via INTRAVENOUS

## 2013-10-19 NOTE — ED Notes (Signed)
PA at bedside.

## 2013-10-19 NOTE — ED Notes (Signed)
Ambulated pt to bathroom w/o difficulty.

## 2013-10-19 NOTE — ED Provider Notes (Signed)
CSN: [id number]     Arrival date & time 10/19/13  1532 History   First MD Initiated Contact with Patient 10/19/13 1532     Chief Complaint  Patient presents with  . Loss of Consciousness     (Consider location/radiation/quality/duration/timing/severity/associated sxs/prior Treatment) Patient is a 58 y.o. female presenting with syncope. The history is provided by the patient and medical records. No language interpreter was used.  Loss of Consciousness Associated symptoms: no chest pain, no diaphoresis, no fever, no headaches, no nausea, no shortness of breath and no vomiting     Crystal [name] is a 58 y.o. female  with a hx of IBS, HLD, IDA presents to the Emergency Department complaining of acute episode of syncope while obtaining a pedicure.  Pts daughter reports full syncope with an unresponsive period of approx 30 seconds.  Patient reports she became very hot prior to her syncopal episode but had no chest pain or shortness of breath, changes in vision, numbness or tingling or headache prior to the episode.  Patient daughter reports she did not fall out of the chair and therefore did not hit her head.  Patient denies personal or family cardiac or CVA history. Patient also endorses a loss of both bowel and bladder during the episode.  EMS reports initial blood pressure 78/50 but improved throughout transport without fluid administration.  Patient reports she feels normal at this time.  Past Medical History  Diagnosis Date  . IBS (irritable bowel syndrome)   . Colon polyp   . Hyperlipidemia   . Wears glasses   . Abdominal pain   . Cyst     right  thyroid  . Insomnia   . Neck pain   . Hemorrhoids   . Mononucleosis   . Migraine   . Thyroid nodule   . Iron deficiency   . Arthritis   . Liver cyst   . FH: colonic polyps   . Hiatal hernia   . Biliary dyskinesia   . Fibroadenoma    Past Surgical History  Procedure Laterality Date  . Fibroadenoma removal  1976    left   .  Endometrial ablation  2010  . Cesarean section      x2  . Cholecystectomy  12/01/11   Family History  Problem Relation Age of Onset  . Hypertension Mother   . Kidney disease Mother   . Hypertension Father   . Cancer Father     colon  . Diabetes Father    History  Substance Use Topics  . Smoking status: Former Research scientist (life sciences)  . Smokeless tobacco: Not on file     Comment: 90yrs ago quit  . Alcohol Use: No   OB History   Grav Para Term Preterm Abortions TAB SAB Ect Mult Living                 Review of Systems  Constitutional: Negative for fever, diaphoresis, appetite change, fatigue and unexpected weight change.  HENT: Negative for mouth sores.   Eyes: Negative for visual disturbance.  Respiratory: Negative for cough, chest tightness, shortness of breath and wheezing.   Cardiovascular: Positive for syncope. Negative for chest pain.  Gastrointestinal: Negative for nausea, vomiting, abdominal pain, diarrhea and constipation.  Endocrine: Negative for polydipsia, polyphagia and polyuria.  Genitourinary: Negative for dysuria, urgency, frequency and hematuria.  Musculoskeletal: Negative for back pain and neck stiffness.  Skin: Negative for rash.  Allergic/Immunologic: Negative for immunocompromised state.  Neurological: Positive for syncope. Negative for light-headedness and  headaches.  Hematological: Does not bruise/bleed easily.  Psychiatric/Behavioral: Negative for sleep disturbance. The patient is not nervous/anxious.       Allergies  Review of patient's allergies indicates no known allergies.  Home Medications   Current Outpatient Rx  Name  Route  Sig  Dispense  Refill  . ibuprofen (ADVIL,MOTRIN) 200 MG tablet   Oral   Take 800 mg by mouth 3 (three) times daily as needed (pain).         . Probiotic Product (PROBIOTIC PO)   Oral   Take 1 capsule by mouth daily.          BP 112/60  Pulse 73  Temp(Src) 98.5 F (36.9 C)  Resp 16  Ht 5\' 3"  (1.6 m)  Wt 142 lb  (64.411 kg)  BMI 25.16 kg/m2  SpO2 99% Physical Exam  Nursing note and vitals reviewed. Constitutional: She is oriented to person, place, and time. She appears well-developed and well-nourished. No distress.  Awake, alert, nontoxic appearance  HENT:  Head: Normocephalic and atraumatic.  Mouth/Throat: Oropharynx is clear and moist. No oropharyngeal exudate.  Eyes: Conjunctivae and EOM are normal. Pupils are equal, round, and reactive to light. No scleral icterus.  Neck: Normal range of motion. Neck supple.  Cardiovascular: Normal rate, regular rhythm, normal heart sounds and intact distal pulses.   No murmur heard. Regular rate and rhythm no murmur  Pulmonary/Chest: Effort normal and breath sounds normal. No respiratory distress. She has no wheezes. She has no rales.  Clear and equal breath sounds  Abdominal: Soft. Bowel sounds are normal. She exhibits no mass. There is no tenderness. There is no rebound and no guarding.  abd soft and nontender  Musculoskeletal: Normal range of motion. She exhibits no edema.  Lymphadenopathy:    She has no cervical adenopathy.  Neurological: She is alert and oriented to person, place, and time. She has normal reflexes. No cranial nerve deficit. She exhibits normal muscle tone. Coordination normal.  Speech is clear and goal oriented, follows commands Cranial nerves III - XII without deficit, no facial droop Normal strength in upper and lower extremities bilaterally, strong and equal grip strength Sensation normal to light and sharp touch Moves extremities without ataxia, coordination intact Normal finger to nose and rapid alternating movements Neg romberg, no pronator drift Normal gait Normal heel-shin and balance   Skin: Skin is warm and dry. No rash noted. She is not diaphoretic.  Psychiatric: She has a normal mood and affect. Her behavior is normal. Judgment and thought content normal.    ED Course  Procedures (including critical care  time) Labs Review Labs Reviewed  CBC WITH DIFFERENTIAL - Abnormal; Notable for the following:    WBC 11.4 (*)    Neutrophils Relative % 78 (*)    Neutro Abs 8.9 (*)    All other components within normal limits  COMPREHENSIVE METABOLIC PANEL - Abnormal; Notable for the following:    Potassium 3.3 (*)    Glucose, Bld 113 (*)    All other components within normal limits  URINALYSIS, ROUTINE W REFLEX MICROSCOPIC  POCT I-STAT TROPONIN I  POCT I-STAT TROPONIN I  POCT CBG (FASTING - GLUCOSE)-MANUAL ENTRY   Imaging Review Dg Chest 2 View  10/19/2013   CLINICAL DATA:  Syncope.  EXAM: CHEST  2 VIEW  COMPARISON:  No priors.  FINDINGS: Lung volumes are normal. No consolidative airspace disease. No pleural effusions. No pneumothorax. No pulmonary nodule or mass noted. Pulmonary vasculature and the cardiomediastinal silhouette  are within normal limits. Surgical clips project over the right upper quadrant of the abdomen, likely from prior cholecystectomy.  IMPRESSION: 1.  No radiographic evidence of acute cardiopulmonary disease.   Electronically Signed   By: Vinnie Langton M.D.   On: 10/19/2013 17:10   Ct Head Wo Contrast  10/19/2013   CLINICAL DATA:  Syncopal episode during pedicure  EXAM: CT HEAD WITHOUT CONTRAST  TECHNIQUE: Contiguous axial images were obtained from the base of the skull through the vertex without intravenous contrast.  COMPARISON:  None.  FINDINGS: Negative for acute intracranial hemorrhage, acute infarction, mass, mass effect, hydrocephalus or midline shift. Gray-white differentiation is preserved throughout. No acute soft tissue or calvarial abnormality. The globes and orbits are symmetric and unremarkable. Normal aeration of the mastoid air cells and visualized paranasal sinuses.  IMPRESSION: Negative head CT.   Electronically Signed   By: Jacqulynn Cadet M.D.   On: 10/19/2013 19:15    EKG Interpretation    Date/Time:  Saturday October 19 2013 15:48:12 EST Ventricular Rate:   64 PR Interval:  156 QRS Duration: 93 QT Interval:  420 QTC Calculation: 433 R Axis:   73 Text Interpretation:  Sinus rhythm Borderline T abnormalities, anterior leads No previous tracing Confirmed by Maryan Rued  MD, WHITNEY ([id number]) on 10/19/2013 4:10:25 PM            MDM   Final diagnoses:  Syncope    Crystal [name] presents via EMS after full, witnessed syncopal episode.  No hx of same.  Pt did not fall or hit her head.  ECG with global T wave flattening; otherwise unremarkable.  Pt A&Ox4 on arrival with stable BP.     6:23PM Initial troponin negative, UA without evidence of urinary tract infection, CBC with mild, nonspecific leukocytosis.  Labs otherwise reassuring. Chest x-ray without evidence of pneumonia or pulmonary edema. Patient ambulates without difficulty and has no orthostasis. Will repeat troponin at 3 hours.  Patient's daughter expresses concern about the lack of head CT.  I discussed with the patient, her daughter and her husband the lack of evidence of seizure and and normal neuro exam. I discussed that a head CT likely will not change our treatment as it is unlikely to find the source of her syncopal episode today.  Family requests CT and patient agrees.  8:08 PM Head CT unremarkable and repeat troponin normal.  Pt continues to feel normal and ambulate without difficulty.    Patient is to be discharged with recommendation to follow up with PCP in regards to today's hospital visit. Syncopal episode has no evidence at this time that it is of cardiac or pulmonary etiology d/t presentation, PERC negative, VSS, no tracheal deviation, no JVD or new murmur, RRR, breath sounds equal bilaterally, EKG without acute abnormalities, negative troponin, and negative CXR. No evidence of ICH, SAH as the cause of pt's episode.  No seizure like activity noted by witnesses.  Pt has been advised to f/u with PCP and return to the ED if symptoms reoccur. Pt appears reliable for follow up and is  agreeable to discharge.   It has been determined that no acute conditions requiring further emergency intervention are present at this time. The patient/guardian have been advised of the diagnosis and plan. We have discussed signs and symptoms that warrant return to the ED, such as changes or worsening in symptoms.   Vital signs are stable at discharge.   BP 112/60  Pulse 73  Temp(Src) 98.5 F (36.9 C)  Resp 16  Ht 5\' 3"  (1.6 m)  Wt 142 lb (64.411 kg)  BMI 25.16 kg/m2  SpO2 99%  Patient/guardian has voiced understanding and agreed to follow-up with the PCP or specialist.     Abigail Butts, PA-C 10/19/13 2020

## 2013-10-19 NOTE — ED Notes (Signed)
EKG completed and given to EDP.  

## 2013-10-19 NOTE — Discharge Instructions (Signed)
1. Medications: usual home medications 2. Treatment: rest, drink plenty of fluids,  3. Follow Up: Please followup with your primary doctor for discussion of your diagnoses and further evaluation after today's visit;    Syncope Syncope is a fainting spell. This means the person loses consciousness and drops to the ground. The person is generally unconscious for less than 5 minutes. The person may have some muscle twitches for up to 15 seconds before waking up and returning to normal. Syncope occurs more often in elderly people, but it can happen to anyone. While most causes of syncope are not dangerous, syncope can be a sign of a serious medical problem. It is important to seek medical care.  CAUSES  Syncope is caused by a sudden decrease in blood flow to the brain. The specific cause is often not determined. Factors that can trigger syncope include:  Taking medicines that lower blood pressure.  Sudden changes in posture, such as standing up suddenly.  Taking more medicine than prescribed.  Standing in one place for too long.  Seizure disorders.  Dehydration and excessive exposure to heat.  Low blood sugar (hypoglycemia).  Straining to have a bowel movement.  Heart disease, irregular heartbeat, or other circulatory problems.  Fear, emotional distress, seeing blood, or severe pain. SYMPTOMS  Right before fainting, you may:  Feel dizzy or lightheaded.  Feel nauseous.  See all white or all black in your field of vision.  Have cold, clammy skin. DIAGNOSIS  Your caregiver will ask about your symptoms, perform a physical exam, and perform electrocardiography (ECG) to record the electrical activity of your heart. Your caregiver may also perform other heart or blood tests to determine the cause of your syncope. TREATMENT  In most cases, no treatment is needed. Depending on the cause of your syncope, your caregiver may recommend changing or stopping some of your medicines. HOME CARE  INSTRUCTIONS  Have someone stay with you until you feel stable.  Do not drive, operate machinery, or play sports until your caregiver says it is okay.  Keep all follow-up appointments as directed by your caregiver.  Lie down right away if you start feeling like you might faint. Breathe deeply and steadily. Wait until all the symptoms have passed.  Drink enough fluids to keep your urine clear or pale yellow.  If you are taking blood pressure or heart medicine, get up slowly, taking several minutes to sit and then stand. This can reduce dizziness. SEEK IMMEDIATE MEDICAL CARE IF:   You have a severe headache.  You have unusual pain in the chest, abdomen, or back.  You are bleeding from the mouth or rectum, or you have black or tarry stool.  You have an irregular or very fast heartbeat.  You have pain with breathing.  You have repeated fainting or seizure-like jerking during an episode.  You faint when sitting or lying down.  You have confusion.  You have difficulty walking.  You have severe weakness.  You have vision problems. If you fainted, call your local emergency services (911 in [place] Do not drive yourself to the hospital.  MAKE SURE YOU:  Understand these instructions.  Will watch your condition.  Will get help right away if you are not doing well or get worse. Document Released: 08/22/2005 Document Revised: 02/21/2012 Document Reviewed: 10/21/2011 [facility] Patient Information 2014 [place].

## 2013-10-19 NOTE — ED Notes (Signed)
CT is not yet completed. Clicking ERROR.

## 2013-10-19 NOTE — ED Notes (Signed)
Onset today while sitting down sudden feeling hot and witnessed syncope on chair for approximately 1 minute.  Upon arrival ax4 answering and following commands appropriate.

## 2013-10-20 NOTE — ED Provider Notes (Signed)
Medical screening examination/treatment/procedure(s) were performed by non-physician practitioner and as supervising physician I was immediately available for consultation/collaboration.  EKG Interpretation    Date/Time:  Saturday October 19 2013 15:48:12 EST Ventricular Rate:  64 PR Interval:  156 QRS Duration: 93 QT Interval:  420 QTC Calculation: 433 R Axis:   73 Text Interpretation:  Sinus rhythm Borderline T abnormalities, anterior leads No previous tracing Confirmed by Maryan Rued  MD, Kandra Graven ([id number]) on 10/19/2013 4:10:25 PM              Blanchie Dessert, MD 10/20/13 1223

## 2013-12-10 ENCOUNTER — Encounter (INDEPENDENT_AMBULATORY_CARE_PROVIDER_SITE_OTHER): Payer: Self-pay | Admitting: Surgery

## 2013-12-11 ENCOUNTER — Other Ambulatory Visit: Payer: Self-pay | Admitting: Obstetrics and Gynecology

## 2013-12-11 DIAGNOSIS — R1011 Right upper quadrant pain: Secondary | ICD-10-CM

## 2013-12-17 ENCOUNTER — Other Ambulatory Visit: Payer: BC Managed Care – PPO

## 2013-12-19 ENCOUNTER — Ambulatory Visit
Admission: RE | Admit: 2013-12-19 | Discharge: 2013-12-19 | Disposition: A | Discharge status: Home / Self Care | Payer: BC Managed Care – PPO | Source: Ambulatory Visit | Attending: Obstetrics and Gynecology | Admitting: Obstetrics and Gynecology

## 2013-12-19 DIAGNOSIS — R1011 Right upper quadrant pain: Secondary | ICD-10-CM

## 2013-12-19 MED ORDER — GADOBENATE DIMEGLUMINE 529 MG/ML IV SOLN
13.0000 mL | Freq: Once | INTRAVENOUS | Status: AC | PRN
  Administered 2013-12-19: 13 mL via INTRAVENOUS

## 2014-01-28 ENCOUNTER — Other Ambulatory Visit (INDEPENDENT_AMBULATORY_CARE_PROVIDER_SITE_OTHER): Payer: Self-pay

## 2014-01-28 DIAGNOSIS — E042 Nontoxic multinodular goiter: Secondary | ICD-10-CM

## 2014-02-03 ENCOUNTER — Ambulatory Visit
Admission: RE | Admit: 2014-02-03 | Discharge: 2014-02-03 | Disposition: A | Discharge status: Home / Self Care | Payer: BC Managed Care – PPO | Source: Ambulatory Visit | Attending: Surgery | Admitting: Surgery

## 2014-02-03 DIAGNOSIS — E042 Nontoxic multinodular goiter: Secondary | ICD-10-CM

## 2014-02-04 ENCOUNTER — Ambulatory Visit (INDEPENDENT_AMBULATORY_CARE_PROVIDER_SITE_OTHER): Payer: BC Managed Care – PPO | Admitting: Surgery

## 2014-02-04 ENCOUNTER — Encounter (INDEPENDENT_AMBULATORY_CARE_PROVIDER_SITE_OTHER): Payer: Self-pay | Admitting: Surgery

## 2014-02-04 VITALS — BP 110/74 | HR 70 | Temp 98.7°F | Resp 12 | Ht 63.5 in | Wt 141.0 lb

## 2014-02-04 DIAGNOSIS — E041 Nontoxic single thyroid nodule: Secondary | ICD-10-CM

## 2014-02-04 NOTE — Patient Instructions (Signed)

## 2014-02-04 NOTE — Progress Notes (Signed)
General Surgery [facility]  Chief Complaint  Patient presents with  . Follow-up    thyroid cyst    HISTORY: Patient is a 58 year old female followed with a thyroid cyst. She has had previous fine-needle aspiration biopsies of small thyroid nodules. All cytopathology results have been benign. Last year the cyst had significantly decreased in size. Ultrasound was performed yesterday to monitor the cyst. On the present examination the cyst has completely resolved. Overall the thyroid is just slightly above normal size with the right lobe measuring 5.4 cm and left lobe measuring 4.7 cm. No significant nodules are identified.  PERTINENT REVIEW OF SYSTEMS: Patient denies compressive symptoms. She denies tremor. She denies palpitations.  EXAM: HEENT: normocephalic; pupils equal and reactive; sclerae clear; dentition good; mucous membranes moist NECK:  No palpable masses in the thyroid bed; symmetric on extension; no palpable anterior or posterior cervical lymphadenopathy; no supraclavicular masses; no tenderness CHEST: clear to auscultation bilaterally without rales, rhonchi, or wheezes CARDIAC: regular rate and rhythm without significant murmur; peripheral pulses are full EXT:  non-tender without edema; no deformity NEURO: no gross focal deficits; no sign of tremor   IMPRESSION: History of right thyroid cyst, resolved  PLAN: The patient and I reviewed the results of her thyroid ultrasound. I have provided her with copies for her medical records. She will be establishing herself with a new primary care physician. She should have a TSH level checked annually.  Patient will return for surgical care as needed.  Earnstine Regal, MD, [facility] Office: [phone]  Visit Diagnoses: 1. Benign thyroid cyst

## 2014-05-07 ENCOUNTER — Ambulatory Visit (INDEPENDENT_AMBULATORY_CARE_PROVIDER_SITE_OTHER): Payer: BC Managed Care – PPO | Admitting: Family Medicine

## 2014-05-07 ENCOUNTER — Encounter: Payer: Self-pay | Admitting: Family Medicine

## 2014-05-07 VITALS — BP 122/82 | HR 63 | Temp 98.2°F | Resp 16 | Ht 63.25 in | Wt 142.4 lb

## 2014-05-07 DIAGNOSIS — F411 Generalized anxiety disorder: Secondary | ICD-10-CM

## 2014-05-07 DIAGNOSIS — R232 Flushing: Secondary | ICD-10-CM | POA: Insufficient documentation

## 2014-05-07 DIAGNOSIS — N951 Menopausal and female climacteric states: Secondary | ICD-10-CM

## 2014-05-07 DIAGNOSIS — G4700 Insomnia, unspecified: Secondary | ICD-10-CM

## 2014-05-07 DIAGNOSIS — E785 Hyperlipidemia, unspecified: Secondary | ICD-10-CM

## 2014-05-07 DIAGNOSIS — R350 Frequency of micturition: Secondary | ICD-10-CM | POA: Insufficient documentation

## 2014-05-07 MED ORDER — TRAZODONE HCL 50 MG PO TABS: 25.0000 mg | ORAL_TABLET | Freq: Every evening | ORAL | Status: DC | PRN

## 2014-05-07 NOTE — Patient Instructions (Addendum)
Follow up in 4-6 weeks to recheck insomnia and anxiety We'll notify you of your lab results and make any changes if needed Start the Trazodone nightly for sleep Decrease Lorazepam to 1/2 tab nightly x1 week and then take 1/2 tab every other night x1-2 weeks and then stop Keep up the good work!  You look great! Call with any questions or concerns Welcome!  We're glad to have you!

## 2014-05-07 NOTE — Assessment & Plan Note (Signed)
New.  Suspect this is directly related to pt's anxiety.  Will wean benzos and start Trazodone for sleep.  Will follow.

## 2014-05-07 NOTE — Progress Notes (Signed)
   Subjective:    Patient ID: Crystal [name], female    DOB: March 14, 1956, 58 y.o.   MRN: [id number]  HPI New to establish.  Previous MD- Dr Maralyn Sago, [facility]).  [place].  GI- Dr Collene Mares.  Thyroid surgeon- [place]  Hyperlipidemia- pt reports that on previous labs she was told that #s were elevated and to pay attention to healthy diet and regular exercise.  Has never been on meds.  Pt reports she is due for 'all my labs'.  Anxiety- chronic problem for pt.  Was prescribed Lexapro, filled it, but never took it.  On Lorazepam as needed- started on 2003.  Insomnia- pt reports difficulty falling asleep and then staying asleep despite eliminating caffeine after 2pm.  Feels her urinary symptoms are interfering w/ sleep.  Urinary frequency- pt reports she is waking 3x/night to use the restroom and does not drink anything after 7.  Urinating more than 1x/hr.  Pt reports that sxs seem to be slowing but she had 2 weeks of sxs.  No dysuria, hematuria.  Hot flashes- pt reports she's having severe hot flashes.  Worse after large meals and at night.  Denies sweating.  sxs will last 1-2 minutes.  Has lowered her thermostat.   Review of Systems For ROS see HPI     Objective:   Physical Exam  Vitals reviewed. Constitutional: She is oriented to person, place, and time. She appears well-developed and well-nourished. No distress.  HENT:  Head: Normocephalic and atraumatic.  Eyes: Conjunctivae and EOM are normal. Pupils are equal, round, and reactive to light.  Neck: Normal range of motion. Neck supple. No thyromegaly present.  Cardiovascular: Normal rate, regular rhythm, normal heart sounds and intact distal pulses.   No murmur heard. Pulmonary/Chest: Effort normal and breath sounds normal. No respiratory distress.  Abdominal: Soft. She exhibits no distension. There is no tenderness.  Musculoskeletal: She exhibits no edema.  Lymphadenopathy:    She has no cervical adenopathy.  Neurological:  She is alert and oriented to person, place, and time.  Skin: Skin is warm and dry.  Psychiatric: She has a normal mood and affect. Her behavior is normal.          Assessment & Plan:

## 2014-05-07 NOTE — Assessment & Plan Note (Signed)
New.  Check UA to r/o infxn, labs to r/o diabetes.  Pt reports sxs are improving spontaneously.  Will determine next steps based on lab results.

## 2014-05-07 NOTE — Assessment & Plan Note (Signed)
Pt reports cholesterol has been elevated in the past but she has not required meds.  Check labs and determine whether meds are needed.

## 2014-05-07 NOTE — Assessment & Plan Note (Signed)
New.  Pt has had many years in between her menopausal hot flashes and her most recent sxs.  Would be unlikely to be related to menopause but not impossible.  Discussed that flushing after eating is not uncommon.  Will check labs to r/o metabolic abnormality and treat abnormalities prn.

## 2014-05-07 NOTE — Assessment & Plan Note (Signed)
New to provider, ongoing for pt.  Is directly related to fact that son is traveling [place] and she is worried about his safety.  Unable to sleep.  Not interested in daily SSRI.  Wants to wean off Lorazepam.  Encouraged her to find stress outlet.  Weaning plan for lorazepam provided.  Will start Trazodone for insomnia.  Will follow.

## 2014-05-07 NOTE — Progress Notes (Signed)
Pre visit review using our clinic review tool, if applicable. No additional management support is needed unless otherwise documented below in the visit note. 

## 2014-05-08 LAB — BASIC METABOLIC PANEL
BUN: 12 mg/dL (ref 6–23)
CHLORIDE: 103 meq/L (ref 96–112)
CO2: 27 meq/L (ref 19–32)
CREATININE: 0.7 mg/dL (ref 0.4–1.2)
Calcium: 9.6 mg/dL (ref 8.4–10.5)
GFR: 97.26 mL/min (ref >60.00)
Glucose, Bld: 76 mg/dL (ref 70–99)
POTASSIUM: 4.6 meq/L (ref 3.5–5.1)
SODIUM: 139 meq/L (ref 135–145)

## 2014-05-08 LAB — CBC WITH DIFFERENTIAL/PLATELET
Basophils Absolute: 0.1 10*3/uL (ref 0.0–0.1)
Basophils Relative: 1.4 % (ref 0.0–3.0)
Eosinophils Absolute: 0.1 10*3/uL (ref 0.0–0.7)
Eosinophils Relative: 1.1 % (ref 0.0–5.0)
HCT: 41 % (ref 36.0–46.0)
HEMOGLOBIN: 13.5 g/dL (ref 12.0–15.0)
LYMPHS PCT: 25.6 % (ref 12.0–46.0)
Lymphs Abs: 2 10*3/uL (ref 0.7–4.0)
MCHC: 33 g/dL (ref 30.0–36.0)
MCV: 90 fl (ref 78.0–100.0)
MONOS PCT: 5.8 % (ref 3.0–12.0)
Monocytes Absolute: 0.5 10*3/uL (ref 0.1–1.0)
Neutro Abs: 5.2 10*3/uL (ref 1.4–7.7)
Neutrophils Relative %: 66.1 % (ref 43.0–77.0)
Platelets: 272 10*3/uL (ref 150.0–400.0)
RBC: 4.55 Mil/uL (ref 3.87–5.11)
RDW: 13.4 % (ref 11.5–15.5)
WBC: 7.9 10*3/uL (ref 4.0–10.5)

## 2014-05-08 LAB — HEPATIC FUNCTION PANEL
ALK PHOS: 64 U/L (ref 39–117)
ALT: 23 U/L (ref 0–35)
AST: 20 U/L (ref 0–37)
Albumin: 4.1 g/dL (ref 3.5–5.2)
Bilirubin, Direct: 0.1 mg/dL (ref 0.0–0.3)
TOTAL PROTEIN: 7.3 g/dL (ref 6.0–8.3)
Total Bilirubin: 1 mg/dL (ref 0.2–1.2)

## 2014-05-08 LAB — LIPID PANEL
CHOL/HDL RATIO: 4
Cholesterol: 240 mg/dL — ABNORMAL HIGH (ref 0–200)
HDL: 64.7 mg/dL (ref >39.00)
LDL Cholesterol: 158 mg/dL — ABNORMAL HIGH (ref 0–99)
NONHDL: 175.3
Triglycerides: 86 mg/dL (ref 0.0–149.0)
VLDL: 17.2 mg/dL (ref 0.0–40.0)

## 2014-05-08 LAB — TSH: TSH: 0.94 u[IU]/mL (ref 0.35–4.50)

## 2014-05-14 ENCOUNTER — Encounter: Payer: Self-pay | Admitting: Family Medicine

## 2014-05-14 DIAGNOSIS — R350 Frequency of micturition: Secondary | ICD-10-CM

## 2014-05-27 LAB — HM PAP SMEAR: HM Pap smear: NORMAL

## 2014-05-27 LAB — HM MAMMOGRAPHY: HM Mammogram: NORMAL

## 2014-05-29 ENCOUNTER — Other Ambulatory Visit: Payer: Self-pay | Admitting: Obstetrics and Gynecology

## 2014-06-02 LAB — CYTOLOGY - PAP

## 2014-06-11 ENCOUNTER — Ambulatory Visit (INDEPENDENT_AMBULATORY_CARE_PROVIDER_SITE_OTHER): Payer: BC Managed Care – PPO

## 2014-06-11 DIAGNOSIS — Z23 Encounter for immunization: Secondary | ICD-10-CM

## 2014-09-02 ENCOUNTER — Telehealth: Payer: Self-pay | Admitting: Family Medicine

## 2014-09-02 ENCOUNTER — Encounter (HOSPITAL_BASED_OUTPATIENT_CLINIC_OR_DEPARTMENT_OTHER): Payer: Self-pay

## 2014-09-02 ENCOUNTER — Emergency Department (HOSPITAL_BASED_OUTPATIENT_CLINIC_OR_DEPARTMENT_OTHER): Payer: BC Managed Care – PPO

## 2014-09-02 ENCOUNTER — Emergency Department (HOSPITAL_BASED_OUTPATIENT_CLINIC_OR_DEPARTMENT_OTHER)
Admission: EM | Admit: 2014-09-02 | Discharge: 2014-09-02 | Disposition: A | Discharge status: Home / Self Care | Payer: BC Managed Care – PPO | Attending: Emergency Medicine | Admitting: Emergency Medicine

## 2014-09-02 DIAGNOSIS — Z79899 Other long term (current) drug therapy: Secondary | ICD-10-CM | POA: Insufficient documentation

## 2014-09-02 DIAGNOSIS — Z862 Personal history of diseases of the blood and blood-forming organs and certain disorders involving the immune mechanism: Secondary | ICD-10-CM | POA: Diagnosis not present

## 2014-09-02 DIAGNOSIS — R079 Chest pain, unspecified: Secondary | ICD-10-CM | POA: Diagnosis present

## 2014-09-02 DIAGNOSIS — M542 Cervicalgia: Secondary | ICD-10-CM | POA: Diagnosis not present

## 2014-09-02 DIAGNOSIS — Z87891 Personal history of nicotine dependence: Secondary | ICD-10-CM | POA: Insufficient documentation

## 2014-09-02 DIAGNOSIS — Z86018 Personal history of other benign neoplasm: Secondary | ICD-10-CM | POA: Insufficient documentation

## 2014-09-02 DIAGNOSIS — Z86010 Personal history of colonic polyps: Secondary | ICD-10-CM | POA: Diagnosis not present

## 2014-09-02 DIAGNOSIS — Z973 Presence of spectacles and contact lenses: Secondary | ICD-10-CM | POA: Insufficient documentation

## 2014-09-02 DIAGNOSIS — Z8669 Personal history of other diseases of the nervous system and sense organs: Secondary | ICD-10-CM | POA: Insufficient documentation

## 2014-09-02 DIAGNOSIS — Z8679 Personal history of other diseases of the circulatory system: Secondary | ICD-10-CM | POA: Insufficient documentation

## 2014-09-02 DIAGNOSIS — Z8719 Personal history of other diseases of the digestive system: Secondary | ICD-10-CM | POA: Diagnosis not present

## 2014-09-02 DIAGNOSIS — R0789 Other chest pain: Secondary | ICD-10-CM

## 2014-09-02 DIAGNOSIS — Z8639 Personal history of other endocrine, nutritional and metabolic disease: Secondary | ICD-10-CM | POA: Diagnosis not present

## 2014-09-02 DIAGNOSIS — Z8619 Personal history of other infectious and parasitic diseases: Secondary | ICD-10-CM | POA: Diagnosis not present

## 2014-09-02 DIAGNOSIS — M1990 Unspecified osteoarthritis, unspecified site: Secondary | ICD-10-CM | POA: Insufficient documentation

## 2014-09-02 LAB — CBC WITH DIFFERENTIAL/PLATELET
Basophils Absolute: 0 10*3/uL (ref 0.0–0.1)
Basophils Relative: 0 % (ref 0–1)
EOS PCT: 6 % — AB (ref 0–5)
Eosinophils Absolute: 0.4 10*3/uL (ref 0.0–0.7)
HEMATOCRIT: 39.8 % (ref 36.0–46.0)
HEMOGLOBIN: 13.2 g/dL (ref 12.0–15.0)
LYMPHS ABS: 1.5 10*3/uL (ref 0.7–4.0)
LYMPHS PCT: 24 % (ref 12–46)
MCH: 29.5 pg (ref 26.0–34.0)
MCHC: 33.2 g/dL (ref 30.0–36.0)
MCV: 89 fL (ref 78.0–100.0)
Monocytes Absolute: 0.5 10*3/uL (ref 0.1–1.0)
Monocytes Relative: 8 % (ref 3–12)
NEUTROS ABS: 4 10*3/uL (ref 1.7–7.7)
Neutrophils Relative %: 62 % (ref 43–77)
Platelets: 241 10*3/uL (ref 150–400)
RBC: 4.47 MIL/uL (ref 3.87–5.11)
RDW: 12.7 % (ref 11.5–15.5)
WBC: 6.4 10*3/uL (ref 4.0–10.5)

## 2014-09-02 LAB — BASIC METABOLIC PANEL
Anion gap: 7 (ref 5–15)
BUN: 15 mg/dL (ref 6–23)
CHLORIDE: 105 meq/L (ref 96–112)
CO2: 27 mmol/L (ref 19–32)
CREATININE: 0.6 mg/dL (ref 0.50–1.10)
Calcium: 9.5 mg/dL (ref 8.4–10.5)
GFR calc Af Amer: >90 mL/min (ref >90)
GFR calc non Af Amer: >90 mL/min (ref >90)
GLUCOSE: 101 mg/dL — AB (ref 70–99)
Potassium: 3.5 mmol/L (ref 3.5–5.1)
Sodium: 139 mmol/L (ref 135–145)

## 2014-09-02 LAB — TROPONIN I: Troponin I: <0.03 ng/mL (ref <0.031)

## 2014-09-02 MED ORDER — PANTOPRAZOLE SODIUM 20 MG PO TBEC: 40.0000 mg | DELAYED_RELEASE_TABLET | Freq: Every day | ORAL | Status: DC

## 2014-09-02 MED ORDER — ASPIRIN 325 MG PO TABS
325.0000 mg | ORAL_TABLET | Freq: Once | ORAL | Status: AC
  Administered 2014-09-02: 325 mg via ORAL
  Filled 2014-09-02: qty 1

## 2014-09-02 MED ORDER — MORPHINE SULFATE 4 MG/ML IJ SOLN: 6.0000 mg | Freq: Once | INTRAMUSCULAR | Status: DC

## 2014-09-02 MED ORDER — ONDANSETRON HCL 4 MG/2ML IJ SOLN
4.0000 mg | Freq: Once | INTRAMUSCULAR | Status: DC
  Filled 2014-09-02: qty 2

## 2014-09-02 MED ORDER — MORPHINE SULFATE 4 MG/ML IJ SOLN
INTRAMUSCULAR | Status: AC
  Filled 2014-09-02: qty 1

## 2014-09-02 MED ORDER — MORPHINE SULFATE 4 MG/ML IJ SOLN: 4.0000 mg | Freq: Once | INTRAMUSCULAR | Status: DC

## 2014-09-02 NOTE — Telephone Encounter (Signed)
Pt went to ER this morning around 10 am.  She has a follow up appointment with Dr. Birdie Riddle on Thursday, Jan. 7, 2016 at 3 pm.

## 2014-09-02 NOTE — ED Notes (Signed)
Pt declined morphine and zofran at this time.  EDP informed.

## 2014-09-02 NOTE — ED Provider Notes (Signed)
CSN: [id number]     Arrival date & time 09/02/14  0957 History   First MD Initiated Contact with Patient 09/02/14 1016     Chief Complaint  Patient presents with  . Chest Pain     (Consider location/radiation/quality/duration/timing/severity/associated sxs/prior Treatment) HPI Comments: Patient complains of one week of constant chest pain just to the left of the sternum, described as a gripping pain.  Worsened by caffeine intake.  Otherwise, no exacerbating or alleviating symptoms including no change with exertion.  2 days ago she began having intermittent burning on the left side of her neck with radiation down the left arm.  States that she has a history of palpitations and chest tightness in the past and had a normal stress test GERD than 5 years ago.  She states that this does not feel like symptoms that she has had with anxiety in the past.  Patient is a 58 y.o. female presenting with chest pain.  Chest Pain Associated symptoms: no abdominal pain, no back pain, no cough, no diaphoresis, no fatigue, no fever, no headache, no nausea, no numbness, no palpitations, no shortness of breath, not vomiting and no weakness     Past Medical History  Diagnosis Date  . IBS (irritable bowel syndrome)   . Colon polyp   . Hyperlipidemia   . Wears glasses   . Abdominal pain   . Cyst     right  thyroid  . Insomnia   . Neck pain   . Hemorrhoids   . Mononucleosis   . Migraine   . Thyroid nodule   . Iron deficiency   . Arthritis   . Liver cyst   . FH: colonic polyps   . Hiatal hernia   . Biliary dyskinesia   . Fibroadenoma    Past Surgical History  Procedure Laterality Date  . Fibroadenoma removal  1976    left   . Endometrial ablation  2010  . Cesarean section      x2  . Cholecystectomy  12/01/11   Family History  Problem Relation Age of Onset  . Hypertension Mother   . Kidney disease Mother   . Hypertension Father   . Cancer Father     colon  . Diabetes Father    History   Substance Use Topics  . Smoking status: Former Smoker    Quit date: 02/05/1984  . Smokeless tobacco: Never Used     Comment: 29yrs ago quit  . Alcohol Use: No   OB History    No data available     Review of Systems  Constitutional: Negative for fever, chills, diaphoresis, activity change, appetite change and fatigue.  HENT: Negative for congestion, facial swelling, rhinorrhea and sore throat.   Eyes: Negative for photophobia and discharge.  Respiratory: Negative for cough, chest tightness and shortness of breath.   Cardiovascular: Positive for chest pain. Negative for palpitations and leg swelling.  Gastrointestinal: Negative for nausea, vomiting, abdominal pain and diarrhea.  Endocrine: Negative for polydipsia and polyuria.  Genitourinary: Negative for dysuria, frequency, difficulty urinating and pelvic pain.  Musculoskeletal: Negative for back pain, arthralgias, neck pain and neck stiffness.  Skin: Negative for color change and wound.  Allergic/Immunologic: Negative for immunocompromised state.  Neurological: Negative for facial asymmetry, weakness, numbness and headaches.  Hematological: Does not bruise/bleed easily.  Psychiatric/Behavioral: Negative for confusion and agitation.      Allergies  Review of patient's allergies indicates no known allergies.  Home Medications   Prior to Admission medications  Medication Sig Start Date End Date Taking? Authorizing Provider  Mirabegron (MYRBETRIQ PO) Take by mouth.   Yes Historical Provider, MD  ibuprofen (ADVIL,MOTRIN) 200 MG tablet Take 800 mg by mouth 3 (three) times daily as needed (pain).    Historical Provider, MD  LORazepam (ATIVAN) 0.5 MG tablet Take 0.5 mg by mouth at bedtime.     Historical Provider, MD  pantoprazole (PROTONIX) 20 MG tablet Take 2 tablets (40 mg total) by mouth daily. 09/02/14   Ernestina Patches, MD  traZODone (DESYREL) 50 MG tablet Take 0.5-1 tablets (25-50 mg total) by mouth at bedtime as needed for  sleep. 05/07/14   Midge Minium, MD   BP 103/57 mmHg  Pulse 58  Temp(Src) 97.5 F (36.4 C) (Oral)  Resp 16  Ht 5' 3.5" (1.613 m)  Wt 140 lb (63.504 kg)  BMI 24.41 kg/m2  SpO2 98% Physical Exam  Constitutional: She is oriented to person, place, and time. She appears well-developed and well-nourished. No distress.  HENT:  Head: Normocephalic and atraumatic.  Mouth/Throat: No oropharyngeal exudate.  Eyes: Pupils are equal, round, and reactive to light.  Neck: Normal range of motion. Neck supple.  Cardiovascular: Normal rate, regular rhythm and normal heart sounds.  Exam reveals no gallop and no friction rub.   No murmur heard. Pulmonary/Chest: Effort normal and breath sounds normal. No respiratory distress. She has no wheezes. She has no rales.  Abdominal: Soft. Bowel sounds are normal. She exhibits no distension and no mass. There is no tenderness. There is no rebound and no guarding.  Musculoskeletal: Normal range of motion. She exhibits no edema or tenderness.  Neurological: She is alert and oriented to person, place, and time.  Skin: Skin is warm and dry.  Psychiatric: She has a normal mood and affect.    ED Course  Procedures (including critical care time) Labs Review Labs Reviewed  CBC WITH DIFFERENTIAL - Abnormal; Notable for the following:    Eosinophils Relative 6 (*)    All other components within normal limits  BASIC METABOLIC PANEL - Abnormal; Notable for the following:    Glucose, Bld 101 (*)    All other components within normal limits  TROPONIN I    Imaging Review Dg Chest 2 View  09/02/2014   CLINICAL DATA:  Mid to left-sided chest pain with burning sensation in the left neck, jaw and left arm. Symptoms began last week.  EXAM: CHEST  2 VIEW  COMPARISON:  10/19/2013  FINDINGS: Artifact overlies the chest. Heart size is normal. Mediastinal shadows are normal. The lungs are clear. No bronchial thickening. No infiltrate, mass, effusion or collapse. Pulmonary  vascularity is normal. No bony abnormality.  IMPRESSION: No active cardiopulmonary disease.   Electronically Signed   By: Nelson Chimes M.D.   On: 09/02/2014 10:38     EKG Interpretation   Date/Time:  Tuesday September 02 2014 10:02:10 EST Ventricular Rate:  75 PR Interval:  144 QRS Duration: 88 QT Interval:  376 QTC Calculation: 419 R Axis:   76 Text Interpretation:  Normal sinus rhythm Normal ECG Borderline T  abnormalities, anterior leads No significant change was found Confirmed by  Ayinde Swim  MD, Jakyia Gaccione ([id number]) on 09/02/2014 10:23:57 AM      MDM   Final diagnoses:  Chest pain at rest  Atypical chest pain    Pt is a 58 y.o. female with Pmhx as above who presents with about one week of a constant chest pain, just left of sternum, described as  a gripping pain.  2 days ago she began having left-sided neck burning with radiation down the left arm.  She's had no associated nausea, vomiting, diaphoresis or shortness of breath.  Pain is not worsened by exertion.  She has no history or risk factors for PE ( PERC negative).  On physical exam, vital signs are stable and she is in no acute distress.  Cardiopulmonary lower extremity exam is benign.  EKG unchanged from priors with no acute ischemic changes, chest x-ray is normal. Trop is negative which is reassuring given she has had 1 week of constant pain. Low risk of MACE by HEART score. Will start on trial of protonix and have her f/u closely with her PCP.      Isaiah E Baccari evaluation in the Emergency Department is complete. It has been determined that no acute conditions requiring further emergency intervention are present at this time. The patient/guardian have been advised of the diagnosis and plan. We have discussed signs and symptoms that warrant return to the ED, such as changes or worsening in symptoms, worsening pain, SOB, leg swelling, n/v, diaphoresis.       Ernestina Patches, MD 09/02/14 [phone]

## 2014-09-02 NOTE — Discharge Instructions (Signed)

## 2014-09-02 NOTE — Telephone Encounter (Addendum)
Pt was transferred to [facility] due to shortness of breath and tightness in chest. Pt requested an appointment after 3pm due to work schedule.pt was offered another location and an earlier appointment time at the [facility] location pt denied  Scheduled patient with PA Johnnette Gourd for 09/03/14 at 3:45pm due to all appointments being filled today.

## 2014-09-02 NOTE — ED Notes (Signed)
Onset of central chest tightness 1 week ago and has been pretty much constant.  Intermittent burning sensation on left side of neck and left arm x 1 week.  Took Pepto without relief.  Previous history of same, evaluated by cardiology, and wore a holter monitor. "It was normal"

## 2014-09-03 ENCOUNTER — Ambulatory Visit: Payer: BC Managed Care – PPO | Admitting: Medical

## 2014-09-11 ENCOUNTER — Ambulatory Visit (INDEPENDENT_AMBULATORY_CARE_PROVIDER_SITE_OTHER): Payer: BLUE CROSS/BLUE SHIELD | Admitting: Family Medicine

## 2014-09-11 ENCOUNTER — Encounter: Payer: Self-pay | Admitting: Family Medicine

## 2014-09-11 VITALS — BP 124/80 | HR 82 | Temp 98.5°F | Resp 17 | Wt 144.2 lb

## 2014-09-11 DIAGNOSIS — J0100 Acute maxillary sinusitis, unspecified: Secondary | ICD-10-CM

## 2014-09-11 DIAGNOSIS — R0789 Other chest pain: Secondary | ICD-10-CM

## 2014-09-11 MED ORDER — PANTOPRAZOLE SODIUM 20 MG PO TBEC: 20.0000 mg | DELAYED_RELEASE_TABLET | Freq: Every day | ORAL | Status: DC

## 2014-09-11 MED ORDER — LORAZEPAM 0.5 MG PO TABS: 0.5000 mg | ORAL_TABLET | Freq: Every day | ORAL | Status: DC

## 2014-09-11 MED ORDER — AMOXICILLIN 875 MG PO TABS: 875.0000 mg | ORAL_TABLET | Freq: Two times a day (BID) | ORAL | Status: DC

## 2014-09-11 NOTE — Assessment & Plan Note (Signed)
Pt's sxs and PE consistent w/ infxn.  Start abx.  Reviewed supportive care and red flags that should prompt return.  Pt expressed understanding and is in agreement w/ plan.  

## 2014-09-11 NOTE — Assessment & Plan Note (Signed)
Pt's CP has completely resolved since starting Protonix.  Will decrease to once daily.  Prescription provided.  Reviewed dietary and lifestyle changes to improve GERD.  Reviewed supportive care and red flags that should prompt return.  Pt expressed understanding and is in agreement w/ plan.

## 2014-09-11 NOTE — Progress Notes (Signed)
   Subjective:    Patient ID: Crystal [name], female    DOB: Mar 13, 1956, 59 y.o.   MRN: [id number]  HPI ER f/u- went on 12/29 for L chest pain, radiating to L neck and arm.  In ER EKG was unchanged.  Troponin was negative.  sxs were worsened by caffeine and pt started on protonix for presumed reflux.  Pt reports sxs are improving.  In regards to protonix, 'that seems to really be helping'.  Pain was described as a central, 'poking pain'.  URI- sxs started 5 days ago.  + facial pain/pressure.  L neck pain.  No fevers.  + nasal congestion.  + HA.  Painful to lean forward.  + sick contacts.  No cough.   Review of Systems For ROS see HPI     Objective:   Physical Exam  Constitutional: She appears well-developed and well-nourished. No distress.  HENT:  Head: Normocephalic and atraumatic.  Right Ear: Tympanic membrane normal.  Left Ear: Tympanic membrane normal.  Nose: Mucosal edema and rhinorrhea present. Right sinus exhibits maxillary sinus tenderness and frontal sinus tenderness. Left sinus exhibits maxillary sinus tenderness and frontal sinus tenderness.  Mouth/Throat: Uvula is midline and mucous membranes are normal. Posterior oropharyngeal erythema present. No oropharyngeal exudate.  Eyes: Conjunctivae and EOM are normal. Pupils are equal, round, and reactive to light.  Neck: Normal range of motion. Neck supple.  Cardiovascular: Normal rate, regular rhythm and normal heart sounds.   Pulmonary/Chest: Effort normal and breath sounds normal. No respiratory distress. She has no wheezes.  Lymphadenopathy:    She has no cervical adenopathy.          Assessment & Plan:

## 2014-09-11 NOTE — Progress Notes (Signed)
Pre visit review using our clinic review tool, if applicable. No additional management support is needed unless otherwise documented below in the visit note. 

## 2014-09-11 NOTE — Patient Instructions (Signed)
Schedule your complete physical for March/April Start the Amoxicillin twice daily- take w/ food Drink plenty of fluids REST! Mucinex DM as needed for cough Call with any questions or concerns Hang in there! Happy New Year!!!

## 2014-10-21 ENCOUNTER — Other Ambulatory Visit: Payer: Self-pay | Admitting: General Practice

## 2014-10-21 MED ORDER — PANTOPRAZOLE SODIUM 20 MG PO TBEC: 20.0000 mg | DELAYED_RELEASE_TABLET | Freq: Every day | ORAL | Status: DC

## 2014-12-22 ENCOUNTER — Telehealth: Payer: Self-pay | Admitting: Family Medicine

## 2014-12-22 NOTE — Telephone Encounter (Signed)
Pre visit letter sent  °

## 2015-01-07 ENCOUNTER — Telehealth: Payer: Self-pay

## 2015-01-07 NOTE — Telephone Encounter (Addendum)
Pre visit completed 

## 2015-01-07 NOTE — Telephone Encounter (Signed)
Patient returned phone call. Best # [phone]

## 2015-01-08 ENCOUNTER — Encounter: Payer: Self-pay | Admitting: Family Medicine

## 2015-01-08 ENCOUNTER — Encounter: Payer: BLUE CROSS/BLUE SHIELD | Admitting: Family Medicine

## 2015-01-08 ENCOUNTER — Ambulatory Visit (INDEPENDENT_AMBULATORY_CARE_PROVIDER_SITE_OTHER): Payer: BLUE CROSS/BLUE SHIELD | Admitting: Family Medicine

## 2015-01-08 VITALS — BP 122/76 | HR 74 | Temp 98.1°F | Resp 16 | Ht 64.0 in | Wt 142.1 lb

## 2015-01-08 DIAGNOSIS — Z23 Encounter for immunization: Secondary | ICD-10-CM | POA: Diagnosis not present

## 2015-01-08 DIAGNOSIS — Z0000 Encounter for general adult medical examination without abnormal findings: Secondary | ICD-10-CM | POA: Insufficient documentation

## 2015-01-08 MED ORDER — LORAZEPAM 0.5 MG PO TABS: 0.5000 mg | ORAL_TABLET | Freq: Every day | ORAL | Status: DC

## 2015-01-08 NOTE — Assessment & Plan Note (Signed)
Pt's PE WNL.  UTD on GYN.  Due for repeat colonoscopy- plans to schedule w/ Dr Collene Mares.  Check labs.  Shingles shot given.  Anticipatory guidance provided.

## 2015-01-08 NOTE — Patient Instructions (Signed)
Follow up in 1 year or as needed We'll notify you of your lab results and make any changes if needed Keep up the good work!  You look great! Call with any questions or concerns Happy Mother's Day!!!

## 2015-01-08 NOTE — Progress Notes (Signed)
   Subjective:    Patient ID: Crystal [name], female    DOB: 08-May-1956, 58 y.o.   MRN: [id number]  HPI CPE- UTD on GYN ([place]), GI- [facility]   Review of Systems Patient reports no vision/ hearing changes, adenopathy,fever, weight change,  persistant/recurrent hoarseness , swallowing issues, chest pain, edema, persistant/recurrent cough, hemoptysis, dyspnea (rest/exertional/paroxysmal nocturnal), gastrointestinal bleeding (melena, rectal bleeding), significant heartburn, bowel changes, GU symptoms (dysuria, hematuria, incontinence), Gyn symptoms (abnormal  bleeding, pain),  syncope, focal weakness, memory loss, numbness & tingling, skin/hair/nail changes, abnormal bruising or bleeding, anxiety, or depression.   + chronic abd pain + palpitations- unchanged from previous    Objective:   Physical Exam General Appearance:    Alert, cooperative, no distress, appears stated age  Head:    Normocephalic, without obvious abnormality, atraumatic  Eyes:    PERRL, conjunctiva/corneas clear, EOM's intact, fundi    benign, both eyes  Ears:    Normal TM's and external ear canals, both ears  Nose:   Nares normal, septum midline, mucosa normal, no drainage    or sinus tenderness  Throat:   Lips, mucosa, and tongue normal; teeth and gums normal  Neck:   Supple, symmetrical, trachea midline, no adenopathy;    Thyroid: no enlargement/tenderness/nodules  Back:     Symmetric, no curvature, ROM normal, no CVA tenderness  Lungs:     Clear to auscultation bilaterally, respirations unlabored  Chest Wall:    No tenderness or deformity   Heart:    Regular rate and rhythm, S1 and S2 normal, no murmur, rub   or gallop  Breast Exam:    Deferred to GYN  Abdomen:     Soft, non-tender, bowel sounds active all four quadrants,    no masses, no organomegaly  Genitalia:    Deferred to GYN  Rectal:    Extremities:   Extremities normal, atraumatic, no cyanosis or edema  Pulses:   2+ and symmetric all extremities  Skin:    Skin color, texture, turgor normal, no rashes or lesions  Lymph nodes:   Cervical, supraclavicular, and axillary nodes normal  Neurologic:   CNII-XII intact, normal strength, sensation and reflexes    throughout          Assessment & Plan:

## 2015-01-08 NOTE — Progress Notes (Signed)
Pre visit review using our clinic review tool, if applicable. No additional management support is needed unless otherwise documented below in the visit note. 

## 2015-01-09 LAB — HEPATIC FUNCTION PANEL
ALBUMIN: 3.9 g/dL (ref 3.5–5.2)
ALK PHOS: 63 U/L (ref 39–117)
ALT: 14 U/L (ref 0–35)
AST: 14 U/L (ref 0–37)
BILIRUBIN DIRECT: 0 mg/dL (ref 0.0–0.3)
TOTAL PROTEIN: 6.9 g/dL (ref 6.0–8.3)
Total Bilirubin: 0.4 mg/dL (ref 0.2–1.2)

## 2015-01-09 LAB — BASIC METABOLIC PANEL
BUN: 13 mg/dL (ref 6–23)
CALCIUM: 9.6 mg/dL (ref 8.4–10.5)
CO2: 25 mEq/L (ref 19–32)
CREATININE: 0.64 mg/dL (ref 0.40–1.20)
Chloride: 107 mEq/L (ref 96–112)
GFR: 100.55 mL/min (ref >60.00)
GLUCOSE: 86 mg/dL (ref 70–99)
Potassium: 4.1 mEq/L (ref 3.5–5.1)
Sodium: 142 mEq/L (ref 135–145)

## 2015-01-09 LAB — LIPID PANEL
Cholesterol: 230 mg/dL — ABNORMAL HIGH (ref 0–200)
HDL: 56.1 mg/dL (ref >39.00)
LDL Cholesterol: 137 mg/dL — ABNORMAL HIGH (ref 0–99)
NonHDL: 173.9
Total CHOL/HDL Ratio: 4
Triglycerides: 185 mg/dL — ABNORMAL HIGH (ref 0.0–149.0)
VLDL: 37 mg/dL (ref 0.0–40.0)

## 2015-01-09 LAB — CBC WITH DIFFERENTIAL/PLATELET
Basophils Absolute: 0.2 10*3/uL — ABNORMAL HIGH (ref 0.0–0.1)
Basophils Relative: 2.9 % (ref 0.0–3.0)
Eosinophils Absolute: 0.1 10*3/uL (ref 0.0–0.7)
Eosinophils Relative: 1.3 % (ref 0.0–5.0)
HEMATOCRIT: 38.3 % (ref 36.0–46.0)
HEMOGLOBIN: 13 g/dL (ref 12.0–15.0)
LYMPHS ABS: 2.1 10*3/uL (ref 0.7–4.0)
Lymphocytes Relative: 27.3 % (ref 12.0–46.0)
MCHC: 33.9 g/dL (ref 30.0–36.0)
MCV: 87 fl (ref 78.0–100.0)
MONO ABS: 0.4 10*3/uL (ref 0.1–1.0)
Monocytes Relative: 5.4 % (ref 3.0–12.0)
NEUTROS ABS: 4.9 10*3/uL (ref 1.4–7.7)
Neutrophils Relative %: 63.1 % (ref 43.0–77.0)
Platelets: 247 10*3/uL (ref 150.0–400.0)
RBC: 4.41 Mil/uL (ref 3.87–5.11)
RDW: 13.4 % (ref 11.5–15.5)
WBC: 7.7 10*3/uL (ref 4.0–10.5)

## 2015-01-09 LAB — TSH: TSH: 1.46 u[IU]/mL (ref 0.35–4.50)

## 2015-01-09 LAB — VITAMIN D 25 HYDROXY (VIT D DEFICIENCY, FRACTURES): VITD: 14.32 ng/mL — AB (ref 30.00–100.00)

## 2015-01-12 ENCOUNTER — Other Ambulatory Visit: Payer: Self-pay | Admitting: General Practice

## 2015-01-12 ENCOUNTER — Encounter: Payer: Self-pay | Admitting: Family Medicine

## 2015-01-12 MED ORDER — VITAMIN D (ERGOCALCIFEROL) 1.25 MG (50000 UNIT) PO CAPS: 50000.0000 [IU] | ORAL_CAPSULE | ORAL | Status: DC

## 2015-05-25 ENCOUNTER — Other Ambulatory Visit: Payer: Self-pay | Admitting: Family Medicine

## 2015-05-25 NOTE — Telephone Encounter (Signed)
Medication filled to pharmacy as requested.   

## 2015-05-25 NOTE — Telephone Encounter (Signed)
Last OV 01/08/15 Lorazepam last filled 01/07/25 #30 with 3

## 2015-10-19 LAB — HM PAP SMEAR

## 2016-01-12 ENCOUNTER — Encounter: Payer: BLUE CROSS/BLUE SHIELD | Admitting: Family Medicine

## 2016-04-29 ENCOUNTER — Ambulatory Visit: Payer: BLUE CROSS/BLUE SHIELD | Admitting: Family Medicine

## 2016-05-16 LAB — HM COLONOSCOPY

## 2016-05-16 LAB — HM MAMMOGRAPHY: HM MAMMO: NORMAL (ref 0–4)

## 2016-12-14 ENCOUNTER — Encounter: Payer: Self-pay | Admitting: Family Medicine

## 2016-12-14 ENCOUNTER — Ambulatory Visit (INDEPENDENT_AMBULATORY_CARE_PROVIDER_SITE_OTHER): Payer: BLUE CROSS/BLUE SHIELD | Admitting: Family Medicine

## 2016-12-14 VITALS — BP 120/78 | HR 76 | Temp 98.1°F | Resp 16 | Ht 64.0 in | Wt 145.5 lb

## 2016-12-14 DIAGNOSIS — E785 Hyperlipidemia, unspecified: Secondary | ICD-10-CM | POA: Diagnosis not present

## 2016-12-14 DIAGNOSIS — E559 Vitamin D deficiency, unspecified: Secondary | ICD-10-CM | POA: Diagnosis not present

## 2016-12-14 LAB — LIPID PANEL
Cholesterol: 263 mg/dL — ABNORMAL HIGH (ref 0–200)
HDL: 72.5 mg/dL (ref >39.00)
LDL Cholesterol: 177 mg/dL — ABNORMAL HIGH (ref 0–99)
NONHDL: 190.31
Total CHOL/HDL Ratio: 4
Triglycerides: 68 mg/dL (ref 0.0–149.0)
VLDL: 13.6 mg/dL (ref 0.0–40.0)

## 2016-12-14 LAB — BASIC METABOLIC PANEL
BUN: 13 mg/dL (ref 6–23)
CHLORIDE: 103 meq/L (ref 96–112)
CO2: 29 mEq/L (ref 19–32)
Calcium: 9.4 mg/dL (ref 8.4–10.5)
Creatinine, Ser: 0.58 mg/dL (ref 0.40–1.20)
GFR: 111.93 mL/min (ref >60.00)
GLUCOSE: 88 mg/dL (ref 70–99)
Potassium: 4.5 mEq/L (ref 3.5–5.1)
Sodium: 139 mEq/L (ref 135–145)

## 2016-12-14 LAB — CBC WITH DIFFERENTIAL/PLATELET
BASOS PCT: 0.9 % (ref 0.0–3.0)
Basophils Absolute: 0.1 10*3/uL (ref 0.0–0.1)
EOS PCT: 1.9 % (ref 0.0–5.0)
Eosinophils Absolute: 0.1 10*3/uL (ref 0.0–0.7)
HCT: 41.9 % (ref 36.0–46.0)
Hemoglobin: 13.8 g/dL (ref 12.0–15.0)
Lymphocytes Relative: 25.5 % (ref 12.0–46.0)
Lymphs Abs: 1.7 10*3/uL (ref 0.7–4.0)
MCHC: 32.9 g/dL (ref 30.0–36.0)
MCV: 89.3 fl (ref 78.0–100.0)
Monocytes Absolute: 0.5 10*3/uL (ref 0.1–1.0)
Monocytes Relative: 7 % (ref 3.0–12.0)
NEUTROS ABS: 4.4 10*3/uL (ref 1.4–7.7)
Neutrophils Relative %: 64.7 % (ref 43.0–77.0)
PLATELETS: 271 10*3/uL (ref 150.0–400.0)
RBC: 4.69 Mil/uL (ref 3.87–5.11)
RDW: 13.5 % (ref 11.5–15.5)
WBC: 6.8 10*3/uL (ref 4.0–10.5)

## 2016-12-14 LAB — HEPATIC FUNCTION PANEL
ALBUMIN: 4.2 g/dL (ref 3.5–5.2)
ALT: 29 U/L (ref 0–35)
AST: 17 U/L (ref 0–37)
Alkaline Phosphatase: 70 U/L (ref 39–117)
Bilirubin, Direct: 0.1 mg/dL (ref 0.0–0.3)
TOTAL PROTEIN: 6.8 g/dL (ref 6.0–8.3)
Total Bilirubin: 0.9 mg/dL (ref 0.2–1.2)

## 2016-12-14 LAB — TSH: TSH: 1.24 u[IU]/mL (ref 0.35–4.50)

## 2016-12-14 LAB — VITAMIN D 25 HYDROXY (VIT D DEFICIENCY, FRACTURES): VITD: 23.23 ng/mL — ABNORMAL LOW (ref 30.00–100.00)

## 2016-12-14 NOTE — Progress Notes (Signed)
   Subjective:    Patient ID: Crystal [name], female    DOB: Jan 12, 1956, 61 y.o.   MRN: [id number]  HPI Hyperlipidemia- noted on labs done in 2016.  At that time, LDL was 137 and total was 230.  She was to work on diet and exercise.  She has gained 4 lbs since last visit.  Denies CP, SOB, HAs, visual changes, abd pain, N/V.  Vit D deficiency- pt's last Vit D level was 14.  Was taking 50,000 units weekly.  + fatigue- no change in activity level, reports excellent sleep.   Review of Systems For ROS see HPI     Objective:   Physical Exam  Constitutional: She is oriented to person, place, and time. She appears well-developed and well-nourished. No distress.  HENT:  Head: Normocephalic and atraumatic.  Eyes: Conjunctivae and EOM are normal. Pupils are equal, round, and reactive to light.  Neck: Normal range of motion. Neck supple. No thyromegaly present.  Cardiovascular: Normal rate, regular rhythm, normal heart sounds and intact distal pulses.   No murmur heard. Pulmonary/Chest: Effort normal and breath sounds normal. No respiratory distress.  Abdominal: Soft. She exhibits no distension. There is no tenderness.  Musculoskeletal: She exhibits no edema.  Lymphadenopathy:    She has no cervical adenopathy.  Neurological: She is alert and oriented to person, place, and time.  Skin: Skin is warm and dry.  Psychiatric: She has a normal mood and affect. Her behavior is normal.  Vitals reviewed.         Assessment & Plan:

## 2016-12-14 NOTE — Assessment & Plan Note (Signed)
Pt has hx of this.  Now having fatigue.  Check labs.  Restart prescription Vit D prn.  Pt expressed understanding and is in agreement w/ plan.

## 2016-12-14 NOTE — Assessment & Plan Note (Signed)
Chronic problem.  Has attempted to control w/ healthy diet and regular exercise but GYN reports that labs are 'a little high'.  Check labs and start meds prn.  Pt expressed understanding and is in agreement w/ plan.

## 2016-12-14 NOTE — Patient Instructions (Signed)
Schedule your complete physical in 6 months We'll notify you of your lab results and make any changes if needed Continue to work on healthy diet and regular exercise- you look great!!! Call with any questions or concerns Happy Spring!! 

## 2016-12-14 NOTE — Progress Notes (Signed)
Pre visit review using our clinic review tool, if applicable. No additional management support is needed unless otherwise documented below in the visit note. 

## 2016-12-15 ENCOUNTER — Encounter: Payer: Self-pay | Admitting: Family Medicine

## 2016-12-16 ENCOUNTER — Other Ambulatory Visit: Payer: Self-pay | Admitting: General Practice

## 2016-12-16 ENCOUNTER — Other Ambulatory Visit: Payer: Self-pay | Admitting: Family Medicine

## 2016-12-16 DIAGNOSIS — E785 Hyperlipidemia, unspecified: Secondary | ICD-10-CM

## 2016-12-16 MED ORDER — ATORVASTATIN CALCIUM 20 MG PO TABS: 20.0000 mg | ORAL_TABLET | Freq: Every day | ORAL | 6 refills | Status: DC

## 2016-12-16 MED ORDER — LORAZEPAM 0.5 MG PO TABS: 0.5000 mg | ORAL_TABLET | Freq: Every day | ORAL | 1 refills | Status: DC

## 2017-02-13 ENCOUNTER — Other Ambulatory Visit: Payer: Self-pay | Admitting: Family Medicine

## 2017-02-13 NOTE — Telephone Encounter (Signed)
Medication filled to pharmacy as requested.   

## 2017-02-13 NOTE — Telephone Encounter (Signed)
Last OV 12/14/16 Lorazepam last filled 12/16/16 #30 with 1

## 2017-05-17 ENCOUNTER — Ambulatory Visit (INDEPENDENT_AMBULATORY_CARE_PROVIDER_SITE_OTHER): Payer: BLUE CROSS/BLUE SHIELD | Admitting: Family Medicine

## 2017-05-17 ENCOUNTER — Other Ambulatory Visit: Payer: Self-pay | Admitting: Family Medicine

## 2017-05-17 ENCOUNTER — Encounter: Payer: Self-pay | Admitting: General Practice

## 2017-05-17 ENCOUNTER — Encounter: Payer: Self-pay | Admitting: Family Medicine

## 2017-05-17 VITALS — BP 100/60 | HR 71 | Temp 98.3°F | Resp 14 | Ht 64.0 in | Wt 145.0 lb

## 2017-05-17 DIAGNOSIS — Z23 Encounter for immunization: Secondary | ICD-10-CM | POA: Diagnosis not present

## 2017-05-17 DIAGNOSIS — E785 Hyperlipidemia, unspecified: Secondary | ICD-10-CM

## 2017-05-17 LAB — HEPATIC FUNCTION PANEL
ALBUMIN: 4.2 g/dL (ref 3.5–5.2)
ALK PHOS: 67 U/L (ref 39–117)
ALT: 15 U/L (ref 0–35)
AST: 12 U/L (ref 0–37)
Bilirubin, Direct: 0.1 mg/dL (ref 0.0–0.3)
Total Bilirubin: 1 mg/dL (ref 0.2–1.2)
Total Protein: 6.3 g/dL (ref 6.0–8.3)

## 2017-05-17 LAB — LIPID PANEL
CHOLESTEROL: 234 mg/dL — AB (ref 0–200)
HDL: 79.5 mg/dL (ref >39.00)
LDL Cholesterol: 142 mg/dL — ABNORMAL HIGH (ref 0–99)
NonHDL: 154.98
Total CHOL/HDL Ratio: 3
Triglycerides: 63 mg/dL (ref 0.0–149.0)
VLDL: 12.6 mg/dL (ref 0.0–40.0)

## 2017-05-17 LAB — BASIC METABOLIC PANEL
BUN: 15 mg/dL (ref 6–23)
CO2: 27 mEq/L (ref 19–32)
Calcium: 9.5 mg/dL (ref 8.4–10.5)
Chloride: 105 mEq/L (ref 96–112)
Creatinine, Ser: 0.54 mg/dL (ref 0.40–1.20)
GFR: 121.38 mL/min (ref >60.00)
Glucose, Bld: 98 mg/dL (ref 70–99)
Potassium: 4.4 mEq/L (ref 3.5–5.1)
SODIUM: 141 meq/L (ref 135–145)

## 2017-05-17 NOTE — Progress Notes (Signed)
Pre visit review using our clinic review tool, if applicable. No additional management support is needed unless otherwise documented below in the visit note. 

## 2017-05-17 NOTE — Telephone Encounter (Signed)
Medication filled to pharmacy as requested.   

## 2017-05-17 NOTE — Progress Notes (Signed)
   Subjective:    Patient ID: Crystal [name], female    DOB: [birth year], 61 y.o.   MRN: [id number]  HPI Hyperlipidemia- chronic problem.  Pt did not start the Lipitor but also admits to not exercising (due to foot issue) nor changing her diet.  Pt reports feeling well but wants to lose weight.  No CP, SOB, HAs, visual changes.   Review of Systems For ROS see HPI     Objective:   Physical Exam  Constitutional: She is oriented to person, place, and time. She appears well-developed and well-nourished. No distress.  HENT:  Head: Normocephalic and atraumatic.  Eyes: Pupils are equal, round, and reactive to light. Conjunctivae and EOM are normal.  Neck: Normal range of motion. Neck supple. No thyromegaly present.  Cardiovascular: Normal rate, regular rhythm, normal heart sounds and intact distal pulses.   No murmur heard. Pulmonary/Chest: Effort normal and breath sounds normal. No respiratory distress.  Abdominal: Soft. She exhibits no distension. There is no tenderness.  Musculoskeletal: She exhibits no edema.  Lymphadenopathy:    She has no cervical adenopathy.  Neurological: She is alert and oriented to person, place, and time.  Skin: Skin is warm and dry.  Psychiatric: She has a normal mood and affect. Her behavior is normal.  Vitals reviewed.         Assessment & Plan:

## 2017-05-17 NOTE — Patient Instructions (Signed)
Schedule your complete physical in 6 months We'll notify you of your lab results and make any changes if needed Try and work on healthy diet and regular exercise- you can do it! Call with any questions or concerns Hang in there!!!

## 2017-05-17 NOTE — Assessment & Plan Note (Signed)
Chronic problem.  Pt never started the Lipitor but admits to not eating right or exercising.  Stressed the need for both.  Check labs.  Adjust meds prn

## 2017-05-17 NOTE — Telephone Encounter (Signed)
Last OV 05/17/17 Lorazepam last filled 02/13/17 #30 with 1

## 2017-08-06 ENCOUNTER — Other Ambulatory Visit: Payer: Self-pay | Admitting: Family Medicine

## 2017-08-07 NOTE — Telephone Encounter (Signed)
Last OV 05/17/17 Lorazepam last filled 05/17/17 #30 with 1

## 2017-08-07 NOTE — Telephone Encounter (Signed)
Medication filled to pharmacy as requested.   [place] for [facility] to Discuss results / PCP recommendations / Schedule patient.

## 2017-10-18 ENCOUNTER — Other Ambulatory Visit: Payer: Self-pay

## 2017-10-18 ENCOUNTER — Ambulatory Visit (INDEPENDENT_AMBULATORY_CARE_PROVIDER_SITE_OTHER): Payer: No Typology Code available for payment source | Admitting: Family Medicine

## 2017-10-18 ENCOUNTER — Encounter: Payer: Self-pay | Admitting: Family Medicine

## 2017-10-18 VITALS — BP 110/61 | HR 61 | Temp 98.0°F | Resp 16 | Ht 64.0 in | Wt 150.1 lb

## 2017-10-18 DIAGNOSIS — Z23 Encounter for immunization: Secondary | ICD-10-CM

## 2017-10-18 DIAGNOSIS — M533 Sacrococcygeal disorders, not elsewhere classified: Secondary | ICD-10-CM

## 2017-10-18 MED ORDER — MELOXICAM 15 MG PO TABS: 15.0000 mg | ORAL_TABLET | Freq: Every day | ORAL | 1 refills | Status: DC

## 2017-10-18 MED ORDER — LORAZEPAM 0.5 MG PO TABS: 0.5000 mg | ORAL_TABLET | Freq: Every day | ORAL | 1 refills | Status: DC

## 2017-10-18 NOTE — Progress Notes (Signed)
   Subjective:    Patient ID: Crystal [name], female    DOB: 11-05-1955, 62 y.o.   MRN: [id number]  HPI LBP- pt reports she has always had pain in tailbone w/ prolonged sitting.  Pt reports the pain has been getting progressively worse for 'a few months'.  No known injury.  Now difficult to get in and out of a car.  Able to lie down and stand up w/o difficulty.  Wore a lidocaine patch x2 days w/ good relief.  No radiation of pain.  This is not similar to her sciatica.  'it feels like I got kicked by a horse'.  Has not taken OTC pain meds.  Some relief w/ Aspercreme.  No bruising or rash present.  No hx of shingles.   Review of Systems For ROS see HPI     Objective:   Physical Exam  Constitutional: She is oriented to person, place, and time. She appears well-developed and well-nourished. No distress.  Musculoskeletal: She exhibits tenderness (TTP over sacrum just below SI joint but no deformity noted). She exhibits no edema or deformity.  Neurological: She is alert and oriented to person, place, and time. She has normal reflexes. Coordination normal.  (-) SLR bilaterally  Skin: Skin is warm and dry. No rash (no rash overlying sacrum) noted. No erythema.          Assessment & Plan:  Sacral back pain- new to provider, ongoing for pt.  Sxs have been worsening over the last few months to the point that it is difficult for pt to sit and particularly to rise from a seated position.  No rash to indicate shingles.  No bruising.  No bony deformity.  Start scheduled NSAID.  [facility] for [facility] and tx.  Reviewed supportive care and red flags that should prompt return.  Pt expressed understanding and is in agreement w/ plan.

## 2017-10-18 NOTE — Patient Instructions (Signed)
Follow up as needed or as scheduled We'll call you with your referral to the [facility] Start the Meloxicam once daily and take w/ food Use ice or heat for pain relief Call with any questions or concerns Hang in there!!!

## 2017-10-23 ENCOUNTER — Telehealth: Payer: Self-pay | Admitting: Family Medicine

## 2017-10-23 DIAGNOSIS — M533 Sacrococcygeal disorders, not elsewhere classified: Secondary | ICD-10-CM

## 2017-10-23 NOTE — Telephone Encounter (Signed)
Please advise, pt wa referred for back pain on 10/18/17

## 2017-10-23 NOTE — Telephone Encounter (Signed)
Copied from [place]. Topic: Quick Communication - See Telephone Encounter >> Oct 23, 2017  2:32 PM Bea Graff, NT wrote: Reason for CRM: Pt states that she would like the xrays and MRIs ordered before she tries to go to a neurologist. She states the neurosurgeon she was referred to is not in network with her insurance. She would like a new referral if need be to Dr. Alexis Goodell. Please advise.

## 2017-10-23 NOTE — Telephone Encounter (Signed)
Spoke with patient, she looked on her insurance website while I was on the phone. Listed under neurosurgeon were the following names: Louie Boston, Cay Schillings, and Javier Docker.   Pt made aware that Xray orders were placed and she could [facility] the [facility] at her convenience to have this completed.

## 2017-10-23 NOTE — Telephone Encounter (Signed)
[place] for sacral and coccyx xrays due to sacral back pain.  Will need these prior to MRI.  Dr Alexis Goodell is listed as a neurohospitalist and I am not sure she has outpt office hrs.  Also, she would benefit from Neurosurg rather than neurology

## 2017-10-24 ENCOUNTER — Ambulatory Visit (HOSPITAL_BASED_OUTPATIENT_CLINIC_OR_DEPARTMENT_OTHER)
Admission: RE | Admit: 2017-10-24 | Discharge: 2017-10-24 | Disposition: A | Discharge status: Home / Self Care | Payer: No Typology Code available for payment source | Source: Ambulatory Visit | Attending: Family Medicine | Admitting: Family Medicine

## 2017-10-24 DIAGNOSIS — M533 Sacrococcygeal disorders, not elsewhere classified: Secondary | ICD-10-CM | POA: Insufficient documentation

## 2017-10-24 NOTE — Telephone Encounter (Signed)
Will hold on MRI and referral until results of xrays available

## 2017-10-25 ENCOUNTER — Other Ambulatory Visit: Payer: Self-pay | Admitting: Family Medicine

## 2017-10-25 ENCOUNTER — Encounter: Payer: Self-pay | Admitting: Family Medicine

## 2017-10-25 DIAGNOSIS — M533 Sacrococcygeal disorders, not elsewhere classified: Secondary | ICD-10-CM

## 2017-10-25 NOTE — Addendum Note (Signed)
Addended by: Davis Gourd on: 10/25/2017 03:27 PM   Modules accepted: Orders

## 2017-12-08 ENCOUNTER — Telehealth: Payer: Self-pay | Admitting: Family Medicine

## 2017-12-08 NOTE — Telephone Encounter (Signed)
Copied from [place]. Topic: Inquiry >> Dec 08, 2017  2:38 PM Scherrie Gerlach wrote: Reason for CRM:  pt states she is not convinced she got a tdap injection on 10/18/17 when she was in the office. (As her chart now reflects.)  Pt states she got a reminder she was overdue for tetanus, and when she called, we said "Oops", you did get a tdap that day. And they would make the correction. Now pt has a bill for a flu and a TDAP for that same day 10/18/17.  Pt states she only got one injection that day. And it did not feel like a TDAP usually feels (sore arm)  Pt got her flu shot back in Sept 2018. So really did not need another one of those either Pt is having a grandchild and needs proof she received the TDAP.

## 2017-12-13 NOTE — Telephone Encounter (Signed)
Spoke to patient this morning, regarding TDAP immunization, Patient states that her arm was not sore and patient is concerned she never got a TDAP. I was able to pull the immunization record paperwork and TDAP was given on 10/18/2017. I advised patient that she did receive this vaccine and was verifed by two CMA's. Patient verbalized Understanding.

## 2017-12-19 ENCOUNTER — Other Ambulatory Visit: Payer: Self-pay | Admitting: Family Medicine

## 2017-12-20 NOTE — Telephone Encounter (Signed)
Last OV 10/18/17 Lorazepam last filled 10/18/17 #30 with 1

## 2018-01-10 NOTE — Telephone Encounter (Signed)
Patient calling again very concerned still regarding injection. Wants to make sure it is  charted correctly, and that she is requesting fees be removed due to error. States she should not charged $175 for a shot and 10 minute office visit. Please advise. Call back [phone]

## 2018-03-19 ENCOUNTER — Other Ambulatory Visit: Payer: Self-pay | Admitting: Family Medicine

## 2018-03-19 NOTE — Telephone Encounter (Signed)
Last OV 10/18/17 Lorazepam last filled 12/21/17 #30 with 1

## 2018-07-05 ENCOUNTER — Other Ambulatory Visit: Payer: Self-pay | Admitting: Family Medicine

## 2018-07-05 NOTE — Telephone Encounter (Signed)
Last OV 10/18/17 Lorazepam last filled 03/19/18 #30 with 1

## 2018-07-05 NOTE — Telephone Encounter (Signed)
Will provide #30 but this is last refill as pt has not been seen in over 1 year to check cholesterol.  Needs to schedule appt

## 2018-07-06 ENCOUNTER — Encounter: Payer: Self-pay | Admitting: General Practice

## 2018-07-23 ENCOUNTER — Telehealth: Payer: Self-pay | Admitting: Family Medicine

## 2018-07-23 NOTE — Telephone Encounter (Signed)
Copied from [place] [id number]. Topic: Quick Communication - See Telephone Encounter >> Jul 23, 2018 10:42 AM Antonieta Iba C wrote: CRM for notification. See Telephone encounter for: 07/23/18.  Pt called in to confirm that med records have been received from Dr Bonnee Quin w/ [facility]. Once records has been received pt is requesting a referral for massage therapy. 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   Pt would like to have it set up at: [facility] in [place].   Pt says that she has an apt this Friday with the location.   Phone: [phone]

## 2018-07-24 NOTE — Telephone Encounter (Signed)
LM for patient to call to discuss.  

## 2018-07-24 NOTE — Telephone Encounter (Signed)
Spoke with patient to let her know we have not received these records.  She stated that she is going to call the other doctor and get them to fax over records.

## 2018-07-24 NOTE — Telephone Encounter (Signed)
I don't have these records to review or to refer

## 2018-07-31 NOTE — Telephone Encounter (Signed)
Pt made aware that we could not place referral. However the handwritten prescription is at the front desk for pick up.

## 2018-07-31 NOTE — Telephone Encounter (Signed)
Notes from [facility] reviewed.  Ok to place referral for massage therapy to [place] [facility] [place].  If no massage referral available, is there a miscellaneous referral?

## 2018-08-09 ENCOUNTER — Other Ambulatory Visit: Payer: Self-pay | Admitting: Emergency Medicine

## 2018-08-09 NOTE — Telephone Encounter (Signed)
Last rx Lorazepam 07/05/18 #30

## 2018-08-10 ENCOUNTER — Other Ambulatory Visit: Payer: Self-pay | Admitting: Family Medicine

## 2018-08-10 MED ORDER — LORAZEPAM 0.5 MG PO TABS: 0.5000 mg | ORAL_TABLET | Freq: Every day | ORAL | 0 refills | Status: DC

## 2018-08-10 NOTE — Telephone Encounter (Signed)
Last OV 10/18/17 Lorazepam last filled 08/10/18 #30 with 0

## 2018-10-02 ENCOUNTER — Telehealth: Payer: Self-pay | Admitting: General Practice

## 2018-10-02 NOTE — Telephone Encounter (Signed)
Per PCP, pt would need an appointment to have this completed. She has not been seen in over a year. Please place her in a 30 minute appt space.    Copied from [place] [id number]. Topic: General - Other >> Oct 02, 2018  9:57 AM Carolyn Stare wrote:  Pt said she would like to come in and have a EKG done and is asking for this because her husband was diagn with afib and had no symptons. She would like to know if this is possible

## 2018-10-02 NOTE — Telephone Encounter (Signed)
Pt is aware of this.  °

## 2018-10-09 LAB — HM MAMMOGRAPHY: HM Mammogram: NORMAL (ref 0–4)

## 2018-10-10 ENCOUNTER — Ambulatory Visit: Payer: No Typology Code available for payment source | Admitting: Family Medicine

## 2018-10-16 ENCOUNTER — Ambulatory Visit: Payer: No Typology Code available for payment source | Admitting: Family Medicine

## 2018-10-16 ENCOUNTER — Other Ambulatory Visit: Payer: Self-pay

## 2018-10-16 ENCOUNTER — Encounter: Payer: Self-pay | Admitting: Family Medicine

## 2018-10-16 VITALS — BP 112/72 | HR 76 | Temp 98.1°F | Resp 16 | Ht 64.0 in | Wt 150.5 lb

## 2018-10-16 DIAGNOSIS — E785 Hyperlipidemia, unspecified: Secondary | ICD-10-CM

## 2018-10-16 DIAGNOSIS — R002 Palpitations: Secondary | ICD-10-CM

## 2018-10-16 LAB — CBC WITH DIFFERENTIAL/PLATELET
BASOS PCT: 0.5 % (ref 0.0–3.0)
Basophils Absolute: 0 10*3/uL (ref 0.0–0.1)
Eosinophils Absolute: 0.1 10*3/uL (ref 0.0–0.7)
Eosinophils Relative: 1.9 % (ref 0.0–5.0)
HCT: 42.8 % (ref 36.0–46.0)
Hemoglobin: 14.2 g/dL (ref 12.0–15.0)
Lymphocytes Relative: 26.4 % (ref 12.0–46.0)
Lymphs Abs: 1.8 10*3/uL (ref 0.7–4.0)
MCHC: 33.1 g/dL (ref 30.0–36.0)
MCV: 89.1 fl (ref 78.0–100.0)
Monocytes Absolute: 0.4 10*3/uL (ref 0.1–1.0)
Monocytes Relative: 6.3 % (ref 3.0–12.0)
Neutro Abs: 4.4 10*3/uL (ref 1.4–7.7)
Neutrophils Relative %: 64.9 % (ref 43.0–77.0)
Platelets: 269 10*3/uL (ref 150.0–400.0)
RBC: 4.8 Mil/uL (ref 3.87–5.11)
RDW: 13.7 % (ref 11.5–15.5)
WBC: 6.7 10*3/uL (ref 4.0–10.5)

## 2018-10-16 LAB — BASIC METABOLIC PANEL
BUN: 12 mg/dL (ref 6–23)
CHLORIDE: 102 meq/L (ref 96–112)
CO2: 28 mEq/L (ref 19–32)
Calcium: 9.7 mg/dL (ref 8.4–10.5)
Creatinine, Ser: 0.6 mg/dL (ref 0.40–1.20)
GFR: 100.67 mL/min (ref >60.00)
Glucose, Bld: 95 mg/dL (ref 70–99)
POTASSIUM: 4.4 meq/L (ref 3.5–5.1)
SODIUM: 139 meq/L (ref 135–145)

## 2018-10-16 LAB — LIPID PANEL
CHOL/HDL RATIO: 4
Cholesterol: 257 mg/dL — ABNORMAL HIGH (ref 0–200)
HDL: 61.5 mg/dL (ref >39.00)
LDL Cholesterol: 177 mg/dL — ABNORMAL HIGH (ref 0–99)
NONHDL: 195.51
TRIGLYCERIDES: 92 mg/dL (ref 0.0–149.0)
VLDL: 18.4 mg/dL (ref 0.0–40.0)

## 2018-10-16 LAB — HEPATIC FUNCTION PANEL
ALK PHOS: 67 U/L (ref 39–117)
ALT: 22 U/L (ref 0–35)
AST: 15 U/L (ref 0–37)
Albumin: 4.4 g/dL (ref 3.5–5.2)
BILIRUBIN DIRECT: 0.1 mg/dL (ref 0.0–0.3)
Total Bilirubin: 0.8 mg/dL (ref 0.2–1.2)
Total Protein: 6.6 g/dL (ref 6.0–8.3)

## 2018-10-16 LAB — TSH: TSH: 1.53 u[IU]/mL (ref 0.35–4.50)

## 2018-10-16 NOTE — Assessment & Plan Note (Signed)
Chronic problem.  Not taking her Lipitor as prescribed.  Check labs and adjust tx plan prn.

## 2018-10-16 NOTE — Progress Notes (Signed)
   Subjective:    Patient ID: Crystal [name], female    DOB: 03-20-1956, 63 y.o.   MRN: [id number]  HPI Palpitations- husband was dx'd w/ Afib in December.  Doctor (Dr Debara Pickett) was describing symptoms and pt felt they all applied to her.  Pt feels that her heart will 'beat out of her chest' when walking up 1 flight of stairs.  Pt has racing heart 'every day, and usually twice a day'.  sxs started ~6 months ago.  Had episode this summer while walking the dog and felt that she was going to pass out when walking up hill.  Denies increased stress recently.  Pt wears watch w/ HR tracking and it will vary between low 50s and 110s.  Average HR is mid 70s  Hyperlipidemia- chronic problem, is supposed to be on Lipitor but not currently taking.  Review of Systems For ROS see HPI     Objective:   Physical Exam Vitals signs reviewed.  Constitutional:      General: She is not in acute distress.    Appearance: She is well-developed.  HENT:     Head: Normocephalic and atraumatic.  Eyes:     Conjunctiva/sclera: Conjunctivae normal.     Pupils: Pupils are equal, round, and reactive to light.  Neck:     Musculoskeletal: Normal range of motion and neck supple.     Thyroid: No thyromegaly.  Cardiovascular:     Rate and Rhythm: Normal rate and regular rhythm.     Heart sounds: Normal heart sounds. No murmur.  Pulmonary:     Effort: Pulmonary effort is normal. No respiratory distress.     Breath sounds: Normal breath sounds.  Abdominal:     General: There is no distension.     Palpations: Abdomen is soft.     Tenderness: There is no abdominal tenderness.  Lymphadenopathy:     Cervical: No cervical adenopathy.  Skin:    General: Skin is warm and dry.  Neurological:     Mental Status: She is alert and oriented to person, place, and time.  Psychiatric:        Behavior: Behavior normal.           Assessment & Plan:  Palpitations- new to provider, ongoing for pt.  Asymptomatic here in office  but occurring regularly and w/ minimal exertion.  EKG WNL.  Check labs to r/o metabolic causes.  Get Holter to assess over a longer time period.  Reviewed supportive care and red flags that should prompt return.  Pt expressed understanding and is in agreement w/ plan.

## 2018-10-16 NOTE — Patient Instructions (Signed)
Schedule your complete physical in 6 months We'll notify you of your lab results and make any changes if needed We'll call you with your Holter Monitor appt Continue to monitor your symptoms and log them Call with any questions or concerns Hang in there!

## 2018-10-17 ENCOUNTER — Ambulatory Visit: Payer: Self-pay | Admitting: Family Medicine

## 2018-10-17 ENCOUNTER — Encounter: Payer: Self-pay | Admitting: Family Medicine

## 2018-10-17 DIAGNOSIS — E785 Hyperlipidemia, unspecified: Secondary | ICD-10-CM

## 2018-10-17 MED ORDER — ATORVASTATIN CALCIUM 20 MG PO TABS: 20.0000 mg | ORAL_TABLET | Freq: Every day | ORAL | 6 refills | Status: DC

## 2018-10-18 ENCOUNTER — Other Ambulatory Visit: Payer: Self-pay | Admitting: *Deleted

## 2018-10-18 DIAGNOSIS — R002 Palpitations: Secondary | ICD-10-CM

## 2018-10-22 ENCOUNTER — Ambulatory Visit (INDEPENDENT_AMBULATORY_CARE_PROVIDER_SITE_OTHER): Payer: No Typology Code available for payment source

## 2018-10-22 DIAGNOSIS — R002 Palpitations: Secondary | ICD-10-CM

## 2018-10-29 ENCOUNTER — Telehealth: Payer: Self-pay

## 2018-10-29 NOTE — Telephone Encounter (Signed)
Pt turned these in on 2/20 they can take up up to a week to be resulted correct?

## 2018-10-29 NOTE — Telephone Encounter (Signed)
Called and advised pt that as soon as we receive the results I will let her know. Stated an understanding.

## 2018-10-29 NOTE — Telephone Encounter (Signed)
These have not been resulted yet.  Cardiology reads these so we are on their time table

## 2018-10-29 NOTE — Telephone Encounter (Signed)
Copied from [place] [id number]. Topic: General - Other >> Oct 29, 2018  9:02 AM Crystal [name] wrote: Reason for CRM: Pt called to get the results from the heart monitor that she turned in. Please advise

## 2018-11-07 NOTE — Addendum Note (Signed)
Addended by: Doran Clay A on: 11/07/2018 02:47 PM   Modules accepted: Orders

## 2018-11-08 ENCOUNTER — Other Ambulatory Visit: Payer: Self-pay | Admitting: Family Medicine

## 2018-11-09 NOTE — Telephone Encounter (Signed)
Last OV 10/16/18 Lorazepam last filled 10/21/17 #30 with 0

## 2018-11-23 IMAGING — DX DG SACRUM/COCCYX 2+V
3 series · 3 of 3 positions shown · non-contrast
Comparison: 02/08/2012 CT abdomen/pelvis

CLINICAL DATA: Sacral pain, chronic, worsening. No reported injury.

EXAM:
SACRUM AND COCCYX - 2+ VIEW

[coccyx ap]
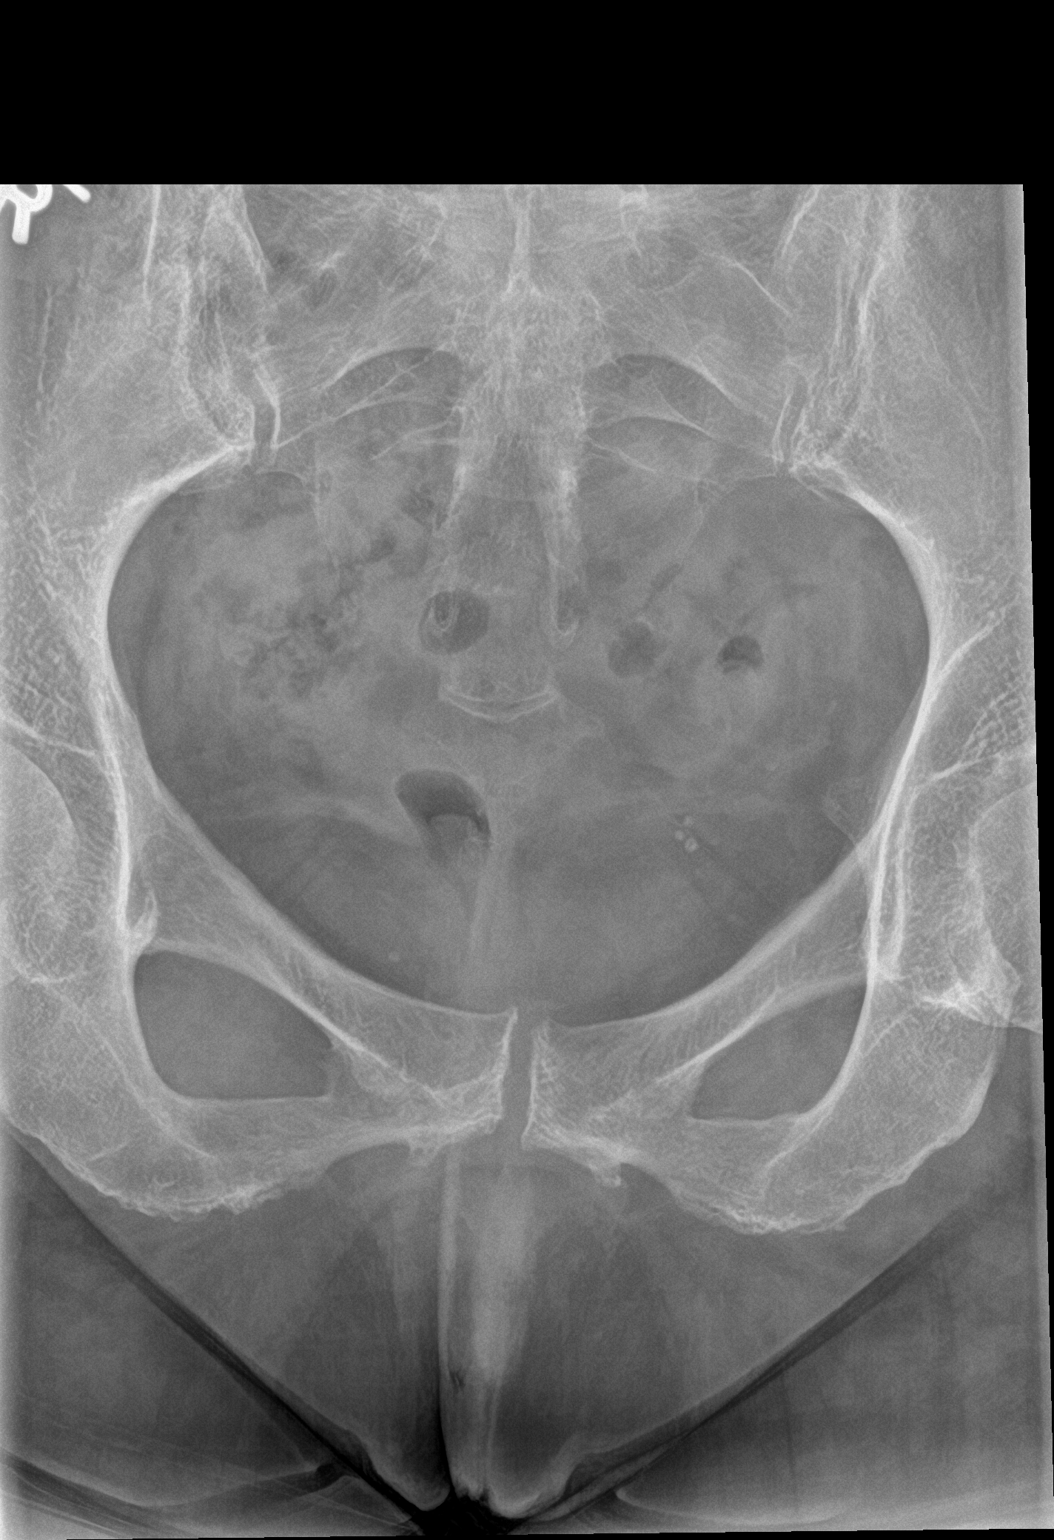

[sacrum ap]
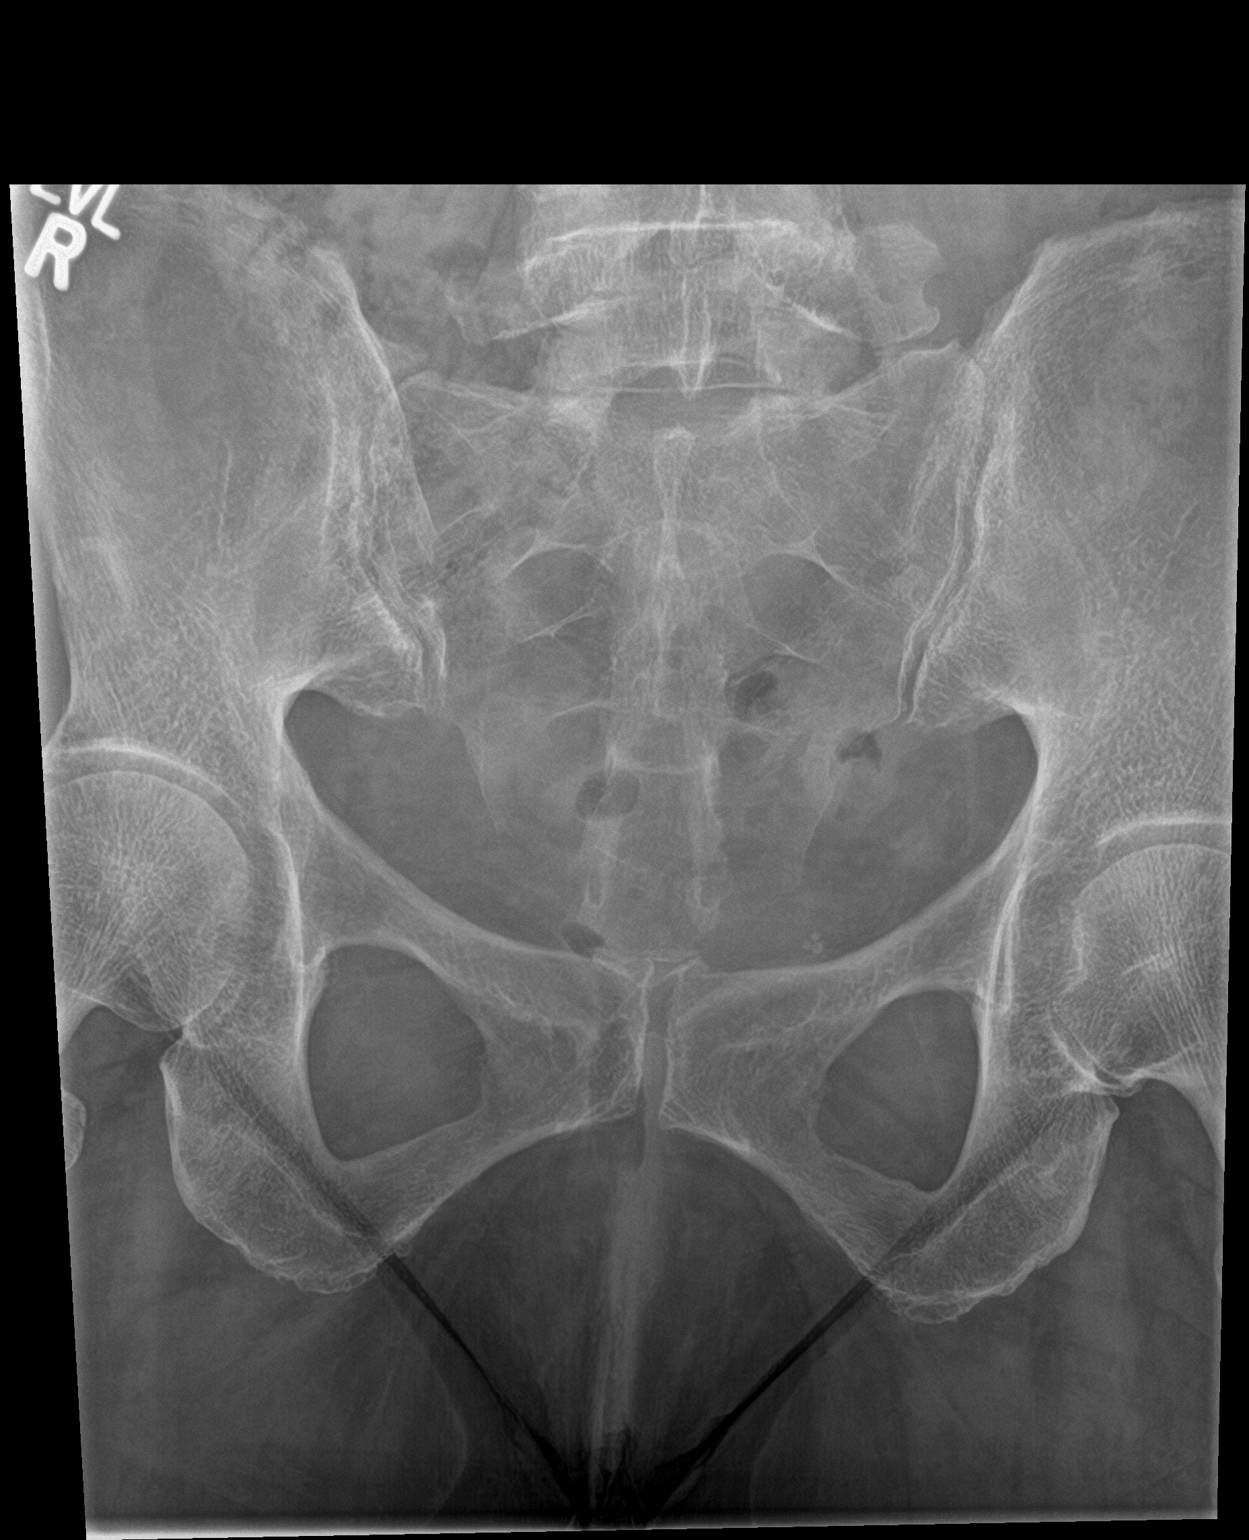

[sacrum lat]
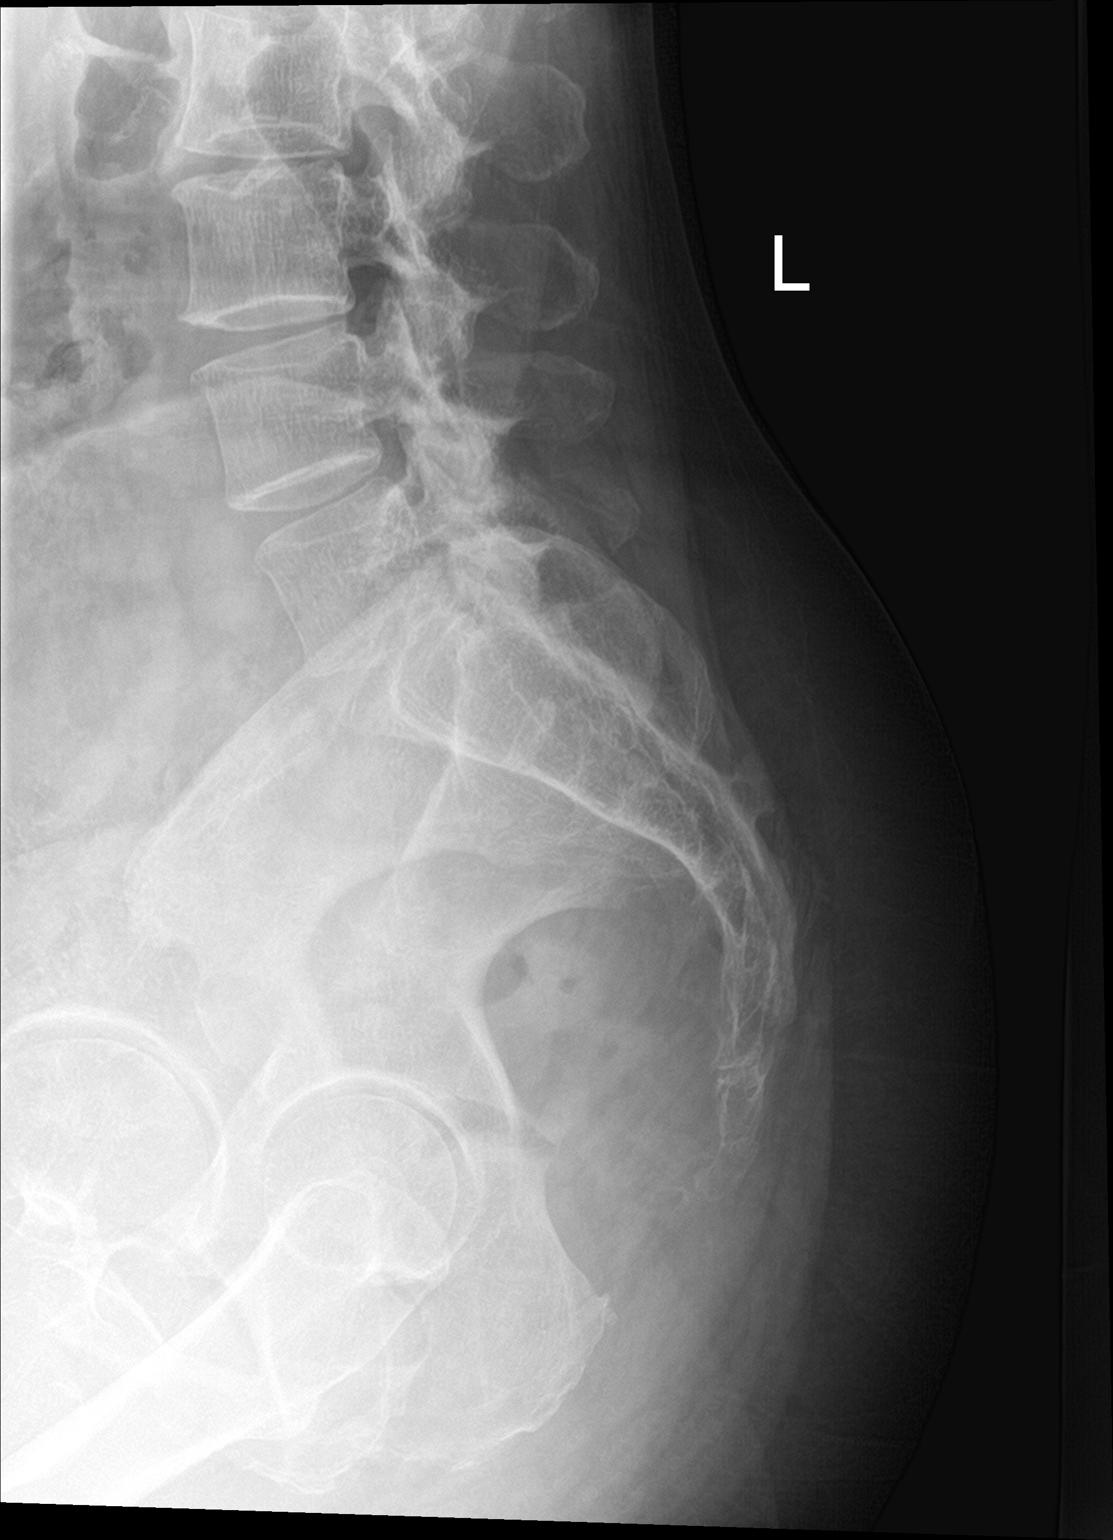

[3 of 3 positions shown; findings below may reference images not displayed]

FINDINGS: There is no evidence of fracture or other focal bone lesions.
Transitional L5 level on the left.
IMPRESSION: Negative.

## 2018-12-10 ENCOUNTER — Other Ambulatory Visit: Payer: Self-pay | Admitting: Family Medicine

## 2018-12-10 ENCOUNTER — Encounter: Payer: Self-pay | Admitting: Family Medicine

## 2018-12-10 NOTE — Telephone Encounter (Signed)
Last refill:11/09/18 #30, 0 Last OV:10/16/18

## 2018-12-10 NOTE — Telephone Encounter (Signed)
Please advise on refill request

## 2018-12-11 MED ORDER — LORAZEPAM 0.5 MG PO TABS: 0.5000 mg | ORAL_TABLET | Freq: Every day | ORAL | 2 refills | Status: DC

## 2018-12-11 NOTE — Telephone Encounter (Signed)
Pt sent MyChart message to Dr. Birdie Riddle and refills have been sent. Pt notified by Dr. Birdie Riddle via [place]

## 2018-12-11 NOTE — Addendum Note (Signed)
Addended by: Midge Minium on: 12/11/2018 08:53 AM   Modules accepted: Orders

## 2018-12-18 ENCOUNTER — Ambulatory Visit: Payer: No Typology Code available for payment source | Admitting: Internal Medicine

## 2018-12-26 ENCOUNTER — Telehealth: Payer: Self-pay

## 2018-12-26 NOTE — Telephone Encounter (Signed)
LM2CB-appt friday for GA?

## 2018-12-26 NOTE — Telephone Encounter (Signed)
-----   Message from Elouise Munroe, MD sent at 12/26/2018  9:44 AM EDT ----- Regarding: RE: new referral Please schedule VIDEO visit with me 4/24, 5/12, 5/14, or 5/15.  Referral looks like palpitations.  Thanks, GA ----- Message ----- From: Waylan Rocher, LPN Sent: 2/41/1464   8:00 AM EDT To: Waylan Rocher, LPN, Elouise Munroe, MD Subject: new referral                                   Good Morning Dr Margaretann Loveless I am not sure if this pt can have a virtual visit. She had a monitor done and a EKG in Feb. I believe she is being referred for palpitations, does not have a referral reason on the referral. Ok for virtual visit? Thank you!

## 2018-12-28 NOTE — Telephone Encounter (Signed)
Ronan

## 2018-12-31 NOTE — Telephone Encounter (Signed)
In looking at notes in last appointment she only wants ONSITE appt not interested in virtual Entered recall for 4 months

## 2019-04-17 ENCOUNTER — Telehealth: Payer: Self-pay | Admitting: Family Medicine

## 2019-04-17 NOTE — Telephone Encounter (Signed)
Please advise 

## 2019-04-17 NOTE — Telephone Encounter (Signed)
Pt called in asking if she could come in for a lab only visit to check her cholesterol. I advised pt that her last office visit was 10/16/2018, Dr. Birdie Riddle wanted her to schedule a cpe in 42mths. She stated that she didn't want to do a cpe, based off her last cpe she had. I advised pt that we would need to set up an appt to come in or do a virtual visit  to discuss her cholesterol, since it has been over 65mths and we have no orders in the system for labs. She stated that she didn't want to come in and pay a fee to just be asked how she has been feeling and how the medication has been working for her. She asked if she could just come in for lab appt.   Please advise if this is ok or if pt needs to come in for an appt.

## 2019-04-17 NOTE — Telephone Encounter (Signed)
Per PCP. Pt would need an appt.

## 2019-04-17 NOTE — Telephone Encounter (Signed)
According to my chart review, she has not had a CPE since 2016.  She is overdue.  Will not be able to order labs w/o appt

## 2019-04-30 ENCOUNTER — Encounter: Payer: Self-pay | Admitting: Family Medicine

## 2019-05-02 ENCOUNTER — Encounter: Payer: Self-pay | Admitting: Family Medicine

## 2019-05-02 ENCOUNTER — Other Ambulatory Visit: Payer: Self-pay

## 2019-05-02 ENCOUNTER — Other Ambulatory Visit: Payer: Self-pay | Admitting: Family Medicine

## 2019-05-02 ENCOUNTER — Ambulatory Visit (INDEPENDENT_AMBULATORY_CARE_PROVIDER_SITE_OTHER): Payer: No Typology Code available for payment source | Admitting: Family Medicine

## 2019-05-02 VITALS — BP 113/80 | HR 76 | Temp 98.0°F | Resp 16 | Ht 64.0 in | Wt 145.1 lb

## 2019-05-02 DIAGNOSIS — E785 Hyperlipidemia, unspecified: Secondary | ICD-10-CM | POA: Diagnosis not present

## 2019-05-02 DIAGNOSIS — E559 Vitamin D deficiency, unspecified: Secondary | ICD-10-CM

## 2019-05-02 DIAGNOSIS — Z23 Encounter for immunization: Secondary | ICD-10-CM | POA: Diagnosis not present

## 2019-05-02 DIAGNOSIS — Z0000 Encounter for general adult medical examination without abnormal findings: Secondary | ICD-10-CM | POA: Diagnosis not present

## 2019-05-02 LAB — HEPATIC FUNCTION PANEL
ALT: 23 U/L (ref 0–35)
AST: 16 U/L (ref 0–37)
Albumin: 4.3 g/dL (ref 3.5–5.2)
Alkaline Phosphatase: 74 U/L (ref 39–117)
Bilirubin, Direct: 0.2 mg/dL (ref 0.0–0.3)
Total Bilirubin: 1.1 mg/dL (ref 0.2–1.2)
Total Protein: 6.5 g/dL (ref 6.0–8.3)

## 2019-05-02 LAB — CBC WITH DIFFERENTIAL/PLATELET
Basophils Absolute: 0 10*3/uL (ref 0.0–0.1)
Basophils Relative: 0.4 % (ref 0.0–3.0)
Eosinophils Absolute: 0.1 10*3/uL (ref 0.0–0.7)
Eosinophils Relative: 1 % (ref 0.0–5.0)
HCT: 39.7 % (ref 36.0–46.0)
Hemoglobin: 13.1 g/dL (ref 12.0–15.0)
Lymphocytes Relative: 24.9 % (ref 12.0–46.0)
Lymphs Abs: 1.7 10*3/uL (ref 0.7–4.0)
MCHC: 33 g/dL (ref 30.0–36.0)
MCV: 89.6 fl (ref 78.0–100.0)
Monocytes Absolute: 0.4 10*3/uL (ref 0.1–1.0)
Monocytes Relative: 5.9 % (ref 3.0–12.0)
Neutro Abs: 4.6 10*3/uL (ref 1.4–7.7)
Neutrophils Relative %: 67.8 % (ref 43.0–77.0)
Platelets: 250 10*3/uL (ref 150.0–400.0)
RBC: 4.43 Mil/uL (ref 3.87–5.11)
RDW: 13.1 % (ref 11.5–15.5)
WBC: 6.8 10*3/uL (ref 4.0–10.5)

## 2019-05-02 LAB — BASIC METABOLIC PANEL
BUN: 12 mg/dL (ref 6–23)
CO2: 30 mEq/L (ref 19–32)
Calcium: 9.4 mg/dL (ref 8.4–10.5)
Chloride: 104 mEq/L (ref 96–112)
Creatinine, Ser: 0.57 mg/dL (ref 0.40–1.20)
GFR: 106.63 mL/min (ref >60.00)
Glucose, Bld: 93 mg/dL (ref 70–99)
Potassium: 4 mEq/L (ref 3.5–5.1)
Sodium: 140 mEq/L (ref 135–145)

## 2019-05-02 LAB — TSH: TSH: 1.38 u[IU]/mL (ref 0.35–4.50)

## 2019-05-02 LAB — LIPID PANEL
Cholesterol: 142 mg/dL (ref 0–200)
HDL: 54.5 mg/dL (ref >39.00)
LDL Cholesterol: 72 mg/dL (ref 0–99)
NonHDL: 87.36
Total CHOL/HDL Ratio: 3
Triglycerides: 77 mg/dL (ref 0.0–149.0)
VLDL: 15.4 mg/dL (ref 0.0–40.0)

## 2019-05-02 LAB — VITAMIN D 25 HYDROXY (VIT D DEFICIENCY, FRACTURES): VITD: 25.14 ng/mL — ABNORMAL LOW (ref 30.00–100.00)

## 2019-05-02 MED ORDER — LORAZEPAM 0.5 MG PO TABS: 0.5000 mg | ORAL_TABLET | Freq: Every day | ORAL | 2 refills | Status: DC

## 2019-05-02 NOTE — Progress Notes (Signed)
   Subjective:    Patient ID: Crystal [name], female    DOB: 1955/10/15, 63 y.o.   MRN: [id number]  HPI CPE- UTD on GYN, colonoscopy.  UTD on Tdap.  Due for flu.   Review of Systems Patient reports no vision/ hearing changes, adenopathy,fever, weight change,  persistant/recurrent hoarseness , swallowing issues, chest pain, palpitations, edema, persistant/recurrent cough, hemoptysis, dyspnea (rest/exertional/paroxysmal nocturnal), gastrointestinal bleeding (melena, rectal bleeding), abdominal pain, significant heartburn, bowel changes, GU symptoms (dysuria, hematuria, incontinence), Gyn symptoms (abnormal  bleeding, pain),  syncope, focal weakness, memory loss, numbness & tingling, skin/hair/nail changes, abnormal bruising or bleeding, anxiety, or depression.     Objective:   Physical Exam General Appearance:    Alert, cooperative, no distress, appears stated age  Head:    Normocephalic, without obvious abnormality, atraumatic  Eyes:    PERRL, conjunctiva/corneas clear, EOM's intact, fundi    benign, both eyes  Ears:    Normal TM's and external ear canals, both ears  Nose:   Deferred due to COVID  Throat:   Neck:   Supple, symmetrical, trachea midline, no adenopathy;    Thyroid: no enlargement/tenderness/nodules  Back:     Symmetric, no curvature, ROM normal, no CVA tenderness  Lungs:     Clear to auscultation bilaterally, respirations unlabored  Chest Wall:    No tenderness or deformity   Heart:    Regular rate and rhythm, S1 and S2 normal, no murmur, rub   or gallop  Breast Exam:    Deferred to GYN  Abdomen:     Soft, non-tender, bowel sounds active all four quadrants,    no masses, no organomegaly  Genitalia:    Deferred to GYN  Rectal:    Extremities:   Extremities normal, atraumatic, no cyanosis or edema  Pulses:   2+ and symmetric all extremities  Skin:   Skin color, texture, turgor normal, no rashes or lesions  Lymph nodes:   Cervical, supraclavicular, and axillary nodes  normal  Neurologic:   CNII-XII intact, normal strength, sensation and reflexes    throughout          Assessment & Plan:

## 2019-05-02 NOTE — Patient Instructions (Signed)
Follow up in 1 year or as needed We'll notify you of your lab results and make any changes if needed Keep up the good work on healthy diet and regular exercise- you look great!!! Call with any questions or concerns Stay Safe!!! 

## 2019-05-02 NOTE — Assessment & Plan Note (Signed)
Chronic problem.  Tolerating statin w/o difficulty.  Check labs.  Adjust meds prn  

## 2019-05-02 NOTE — Assessment & Plan Note (Signed)
Pt w/ hx of this.  Check labs and replete prn.

## 2019-05-02 NOTE — Assessment & Plan Note (Signed)
Pt's PE WNL.  UTD on GYN, colonoscopy.  Flu shot given today.  Check labs.  Anticipatory guidance provided.

## 2019-05-02 NOTE — Progress Notes (Signed)
Filled at pt's request 

## 2019-05-03 ENCOUNTER — Ambulatory Visit: Payer: No Typology Code available for payment source | Admitting: Family Medicine

## 2019-05-03 ENCOUNTER — Other Ambulatory Visit: Payer: Self-pay | Admitting: General Practice

## 2019-05-03 MED ORDER — VITAMIN D (ERGOCALCIFEROL) 1.25 MG (50000 UNIT) PO CAPS: 50000.0000 [IU] | ORAL_CAPSULE | ORAL | 0 refills | Status: DC

## 2019-05-03 MED ORDER — ATORVASTATIN CALCIUM 20 MG PO TABS: 20.0000 mg | ORAL_TABLET | Freq: Every day | ORAL | 1 refills | Status: DC

## 2019-05-24 ENCOUNTER — Encounter: Payer: Self-pay | Admitting: Family Medicine

## 2019-07-08 ENCOUNTER — Encounter: Payer: Self-pay | Admitting: Family Medicine

## 2019-07-10 ENCOUNTER — Encounter: Payer: Self-pay | Admitting: Family Medicine

## 2019-08-08 ENCOUNTER — Other Ambulatory Visit: Payer: Self-pay | Admitting: General Practice

## 2019-08-08 MED ORDER — LORAZEPAM 0.5 MG PO TABS: 0.5000 mg | ORAL_TABLET | Freq: Every day | ORAL | 2 refills | Status: DC

## 2019-08-08 NOTE — Telephone Encounter (Signed)
Last OV 05/02/19 Lorazepam last filled 05/02/19 #30 with 2

## 2019-09-21 ENCOUNTER — Other Ambulatory Visit: Payer: Self-pay | Admitting: Family Medicine

## 2019-10-01 ENCOUNTER — Encounter: Payer: Self-pay | Admitting: Family Medicine

## 2019-10-01 DIAGNOSIS — R002 Palpitations: Secondary | ICD-10-CM

## 2019-11-07 DIAGNOSIS — M722 Plantar fascial fibromatosis: Secondary | ICD-10-CM | POA: Diagnosis not present

## 2019-11-07 DIAGNOSIS — M79671 Pain in right foot: Secondary | ICD-10-CM | POA: Diagnosis not present

## 2019-11-07 DIAGNOSIS — M7731 Calcaneal spur, right foot: Secondary | ICD-10-CM | POA: Diagnosis not present

## 2019-11-07 DIAGNOSIS — M19071 Primary osteoarthritis, right ankle and foot: Secondary | ICD-10-CM | POA: Diagnosis not present

## 2019-11-07 DIAGNOSIS — M85871 Other specified disorders of bone density and structure, right ankle and foot: Secondary | ICD-10-CM | POA: Diagnosis not present

## 2019-11-12 ENCOUNTER — Encounter: Payer: Self-pay | Admitting: Internal Medicine

## 2019-11-12 ENCOUNTER — Ambulatory Visit: Payer: Medicare Other | Admitting: Internal Medicine

## 2019-11-12 ENCOUNTER — Other Ambulatory Visit: Payer: Self-pay

## 2019-11-12 ENCOUNTER — Encounter: Payer: Self-pay | Admitting: *Deleted

## 2019-11-12 VITALS — BP 126/72 | HR 91 | Temp 97.2°F | Ht 63.0 in | Wt 142.6 lb

## 2019-11-12 DIAGNOSIS — I479 Paroxysmal tachycardia, unspecified: Secondary | ICD-10-CM | POA: Diagnosis not present

## 2019-11-12 NOTE — Patient Instructions (Signed)
Medication Instructions:  Your physician recommends that you continue on your current medications as directed. Please refer to the Current Medication list given to you today.  *If you need a refill on your cardiac medications before your next appointment, please call your pharmacy*  Testing/Procedures: Your physician has recommended that you wear an event monitor for 30 days. Event monitors are medical devices that record the heart's electrical activity. Doctors most often [place] these monitors to diagnose arrhythmias. Arrhythmias are problems with the speed or rhythm of the heartbeat. The monitor is a small, portable device. You can wear one while you do your normal daily activities. This is usually used to diagnose what is causing palpitations/syncope (passing out).  This will be mailed to your home with instructions on how to apply, wear, return monitor     Follow-Up: At [facility], you and your health needs are our priority.  As part of our continuing mission to provide you with exceptional heart care, we have created designated Provider Care Teams.  These Care Teams include your primary Cardiologist (physician) and Advanced Practice Providers (APPs -  Physician Assistants and Nurse Practitioners) who all work together to provide you with the care you need, when you need it.  We recommend signing up for the patient portal called "MyChart".  Sign up information is provided on this After Visit Summary.  MyChart is used to connect with patients for Virtual Visits (Telemedicine).  Patients are able to view lab/test results, encounter notes, upcoming appointments, etc.  Non-urgent messages can be sent to your provider as well.   To learn more about what you can do with MyChart, go to [email or web address].    Your next appointment:   8 week(s)  The format for your next appointment:   In Person  Provider:   You may see Dr. Debara Pickett or one of the following Advanced Practice Providers on your  designated Care Team:    Almyra Deforest, PA-C  Fabian Sharp, [place] or   Roby Lofts, [place]    Other Instructions

## 2019-11-12 NOTE — Progress Notes (Signed)
OFFICE CONSULT NOTE  Chief Complaint:  Tachycardia  Primary Care Physician: Crystal Minium, MD  HPI:  Crystal [name] is a 64 y.o. female who is being seen today for the evaluation of tachycardia at the request of Tabori, Aundra Millet, MD.  Crystal [name] is a pleasant 64 year old female whose husband has been a patient of mine.  Recently she has been having issues with recurrent tachycardia.  This is paroxysmal and not described by her as palpitations rather she notes her heart racing at times and she feels that if she coughs that the symptoms resolved.  It tends to come in paroxysms and can be present for several days and then not for several days or weeks.  In the past has been evaluated both by cardiology and her primary provider with regards to these.  She is worn monitors it sounds like between 24 and 72 hours which have not been revealing.  She denies any anxiety but does report difficulty sleeping which has been a problem for years for which she takes lorazepam.  PMHx:  Past Medical History:  Diagnosis Date  . Abdominal pain   . Arthritis   . Biliary dyskinesia   . Colon polyp   . Cyst    right  thyroid  . FH: colonic polyps   . Fibroadenoma   . Hemorrhoids   . Hiatal hernia   . Hyperlipidemia   . IBS (irritable bowel syndrome)   . Insomnia   . Iron deficiency   . Liver cyst   . Migraine   . Mononucleosis   . Neck pain   . Thyroid nodule   . Wears glasses     Past Surgical History:  Procedure Laterality Date  . CESAREAN SECTION     x2  . CHOLECYSTECTOMY  12/01/11  . ENDOMETRIAL ABLATION  2010  . fibroadenoma removal  1976   left     FAMHx:  Family History  Problem Relation Age of Onset  . Hypertension Mother   . Kidney disease Mother   . Hypertension Father   . Cancer Father        colon  . Diabetes Father   . Cancer Sister        Breast     SOCHx:   reports that she quit smoking about 35 years ago. She has never used smokeless tobacco. She  reports that she does not drink alcohol or use drugs.  ALLERGIES:  No Known Allergies  ROS: Pertinent items noted in HPI and remainder of comprehensive ROS otherwise negative.  HOME MEDS: Current Outpatient Medications on File Prior to Visit  Medication Sig Dispense Refill  . atorvastatin (LIPITOR) 20 MG tablet TAKE 1 TABLET BY MOUTH EVERY DAY 30 tablet 1  . calcium-vitamin D (OSCAL WITH D) 500-200 MG-UNIT tablet Take 1 tablet by mouth.    Marland Kitchen LORazepam (ATIVAN) 0.5 MG tablet Take 1 tablet (0.5 mg total) by mouth daily. 30 tablet 2  . Multiple Vitamins-Minerals (AIRBORNE PO) Take by mouth.    . Probiotic Product (PROBIOTIC DAILY PO) Take by mouth.    Marland Kitchen ibuprofen (ADVIL,MOTRIN) 200 MG tablet Take 800 mg by mouth 3 (three) times daily as needed (pain).    . Vitamin D, Ergocalciferol, (DRISDOL) 1.25 MG (50000 UT) CAPS capsule Take 1 capsule (50,000 Units total) by mouth every 7 (seven) days. (Patient not taking: Reported on 11/12/2019) 12 capsule 0   No current facility-administered medications on file prior to visit.    LABS/IMAGING: No  results found for this or any previous visit (from the past 75 hour(s)). No results found.  LIPID PANEL:    Component Value Date/Time   CHOL 142 05/02/2019 1205   TRIG 77.0 05/02/2019 1205   HDL 54.50 05/02/2019 1205   CHOLHDL 3 05/02/2019 1205   VLDL 15.4 05/02/2019 1205   LDLCALC 72 05/02/2019 1205    WEIGHTS: Wt Readings from Last 3 Encounters:  11/12/19 142 lb 9.6 oz (64.7 kg)  05/02/19 145 lb 2 oz (65.8 kg)  10/16/18 150 lb 8 oz (68.3 kg)    VITALS: BP 126/72   Pulse 91   Temp (!) 97.2 F (36.2 C)   Ht 5\' 3"  (1.6 m)   Wt 142 lb 9.6 oz (64.7 kg)   SpO2 96%   BMI 25.26 kg/m   EXAM: General appearance: alert and no distress Neck: no carotid bruit, no JVD and thyroid not enlarged, symmetric, no tenderness/mass/nodules Lungs: clear to auscultation bilaterally Heart: regular rate and rhythm Abdomen: soft, non-tender; bowel sounds  normal; no masses,  no organomegaly Extremities: extremities normal, atraumatic, no cyanosis or edema Pulses: 2+ and symmetric Skin: Skin color, texture, turgor normal. No rashes or lesions Neurologic: Grossly normal Psych: Pleasant  EKG: Normal sinus rhythm 69, normal axis and intervals- personally reviewed  ASSESSMENT: 1. Paroxysmal tachycardia  PLAN: 1.   Crystal [name] reports some paroxysmal tachycardia which is sometimes improved with coughing.  This could represent an atrial arrhythmia.  This seems to happen fairly infrequently but episodes are clustered.  I do not believe she has been monitored enough in the past to really determine what the rhythms are.  I would like to place a 30-day event monitor to see if we can better capture what is happening.  In addition I have reviewed lab work from her PCP including last summer which showed normal thyroid function and no other clear causes of her episodes.  She has few cardiovascular risk factors, including no history of hypertension, diabetes, alcohol tobacco use or family history of heart disease.  Plan follow-up with me after in about 2 months.  Thanks for the kind referral.  Pixie Casino, MD, FACC, [place] Director of the [facility]  [facility] Diplomate of the American Board of Clinical Lipidology Attending Cardiologist  Direct Dial: [phone]  Fax: [phone]  Website:  [email or web address] 11/12/2019, 4:41 PM

## 2019-11-12 NOTE — Progress Notes (Signed)
Patient ID: Crystal [name], female   DOB: 1956-01-16, 64 y.o.   MRN: [id number] Patient enrolled for [employer] to ship a 30 day cardiac event monitor to her home.  Instructions sent to patient via My Chart message and will also be included in her monitor kit.

## 2019-11-20 ENCOUNTER — Ambulatory Visit (INDEPENDENT_AMBULATORY_CARE_PROVIDER_SITE_OTHER): Payer: Medicare Other

## 2019-11-20 DIAGNOSIS — I479 Paroxysmal tachycardia, unspecified: Secondary | ICD-10-CM

## 2019-11-20 DIAGNOSIS — E785 Hyperlipidemia, unspecified: Secondary | ICD-10-CM | POA: Diagnosis not present

## 2019-11-20 DIAGNOSIS — Z1329 Encounter for screening for other suspected endocrine disorder: Secondary | ICD-10-CM | POA: Diagnosis not present

## 2019-11-20 DIAGNOSIS — R002 Palpitations: Secondary | ICD-10-CM | POA: Diagnosis not present

## 2019-11-20 DIAGNOSIS — Z1321 Encounter for screening for nutritional disorder: Secondary | ICD-10-CM | POA: Diagnosis not present

## 2019-11-20 DIAGNOSIS — Z13228 Encounter for screening for other metabolic disorders: Secondary | ICD-10-CM | POA: Diagnosis not present

## 2019-11-21 ENCOUNTER — Encounter: Payer: Self-pay | Admitting: General Practice

## 2019-11-21 LAB — LIPID PANEL
Cholesterol: 111 (ref 0–200)
HDL: 44 (ref 35–70)
LDL Cholesterol: 55
LDl/HDL Ratio: 1.3
Triglycerides: 53 (ref 40–160)

## 2019-11-21 LAB — VITAMIN D 25 HYDROXY (VIT D DEFICIENCY, FRACTURES): Vit D, 25-Hydroxy: 75.7

## 2019-11-21 LAB — TSH: TSH: 1.19 (ref 0.41–5.90)

## 2019-11-28 ENCOUNTER — Other Ambulatory Visit: Payer: Self-pay | Admitting: Family Medicine

## 2019-11-28 NOTE — Telephone Encounter (Signed)
Last OV 05/02/19 Lorazepam last filled 08/08/19 #30 with 2

## 2019-12-02 ENCOUNTER — Telehealth: Payer: Medicare Other | Admitting: Physician Assistant

## 2019-12-02 DIAGNOSIS — H903 Sensorineural hearing loss, bilateral: Secondary | ICD-10-CM | POA: Diagnosis not present

## 2019-12-03 DIAGNOSIS — H2513 Age-related nuclear cataract, bilateral: Secondary | ICD-10-CM | POA: Diagnosis not present

## 2019-12-03 DIAGNOSIS — H524 Presbyopia: Secondary | ICD-10-CM | POA: Diagnosis not present

## 2019-12-03 DIAGNOSIS — H5203 Hypermetropia, bilateral: Secondary | ICD-10-CM | POA: Diagnosis not present

## 2019-12-03 DIAGNOSIS — H52203 Unspecified astigmatism, bilateral: Secondary | ICD-10-CM | POA: Diagnosis not present

## 2019-12-31 ENCOUNTER — Telehealth: Payer: Medicare Other | Admitting: Internal Medicine

## 2020-01-21 DIAGNOSIS — I788 Other diseases of capillaries: Secondary | ICD-10-CM | POA: Diagnosis not present

## 2020-01-21 DIAGNOSIS — L821 Other seborrheic keratosis: Secondary | ICD-10-CM | POA: Diagnosis not present

## 2020-01-21 DIAGNOSIS — D2261 Melanocytic nevi of right upper limb, including shoulder: Secondary | ICD-10-CM | POA: Diagnosis not present

## 2020-01-22 DIAGNOSIS — Z1231 Encounter for screening mammogram for malignant neoplasm of breast: Secondary | ICD-10-CM | POA: Diagnosis not present

## 2020-01-24 ENCOUNTER — Ambulatory Visit: Payer: Medicare Other | Admitting: Internal Medicine

## 2020-01-27 ENCOUNTER — Other Ambulatory Visit: Payer: Self-pay | Admitting: Obstetrics and Gynecology

## 2020-01-27 DIAGNOSIS — R928 Other abnormal and inconclusive findings on diagnostic imaging of breast: Secondary | ICD-10-CM

## 2020-01-28 ENCOUNTER — Ambulatory Visit (INDEPENDENT_AMBULATORY_CARE_PROVIDER_SITE_OTHER)
Admission: RE | Admit: 2020-01-28 | Discharge: 2020-01-28 | Disposition: A | Discharge status: Home / Self Care | Payer: Medicare Other | Source: Ambulatory Visit

## 2020-01-28 DIAGNOSIS — R05 Cough: Secondary | ICD-10-CM | POA: Diagnosis not present

## 2020-01-28 DIAGNOSIS — R059 Cough, unspecified: Secondary | ICD-10-CM

## 2020-01-28 DIAGNOSIS — R0981 Nasal congestion: Secondary | ICD-10-CM

## 2020-01-28 DIAGNOSIS — J3489 Other specified disorders of nose and nasal sinuses: Secondary | ICD-10-CM | POA: Diagnosis not present

## 2020-01-28 MED ORDER — PREDNISONE 10 MG (21) PO TBPK: ORAL_TABLET | Freq: Every day | ORAL | 0 refills | Status: AC

## 2020-01-28 MED ORDER — HYDROCODONE-HOMATROPINE 5-1.5 MG/5ML PO SYRP: 5.0000 mL | ORAL_SOLUTION | Freq: Four times a day (QID) | ORAL | 0 refills | Status: DC | PRN

## 2020-01-28 MED ORDER — AMOXICILLIN 875 MG PO TABS: 875.0000 mg | ORAL_TABLET | Freq: Two times a day (BID) | ORAL | 0 refills | Status: AC

## 2020-01-28 NOTE — Discharge Instructions (Addendum)
You have a sinus infection.  I have prescribed amoxicillin 875mg  twice a day for 10 days.  I have prescribed Hycodan syrup for your cough.  I have prescribed a steroid taper for you to take. Take this as directed on the label.  Follow up with this office or with primary care if you are not feeling better over the next 2 days.  Follow up with the ER for trouble swallowing, trouble breathing, other concerning symptoms.

## 2020-01-28 NOTE — ED Provider Notes (Signed)
[place]  Virtual Visit via Video Note:  Crystal [name]  initiated request for Telemedicine visit with [facility] team. I connected with Crystal [name]  on 01/28/2020 at 5:48 PM  for a synchronized telemedicine visit using a video enabled HIPPA compliant telemedicine application. I verified that I am speaking with Crystal [name]  using two identifiers. Bettey Mare, NP  was physically located in a [facility] site and [place] was located at a different location.   The limitations of evaluation and management by telemedicine as well as the availability of in-person appointments were discussed. Patient was informed that she  may incur a bill ( including co-pay) for this virtual visit encounter. Crystal [name]  expressed understanding and gave verbal consent to proceed with virtual visit.   [id number] 01/28/20 Arrival Time: 1735  [id number] THROAT  SUBJECTIVE: History from: patient.  Crystal [name] is a 64 y.o. female who presents with abrupt onset of sore throat, cough, nasal congestion, headache, sinus pain and pressure 6 days ago. Denies to sick exposure to strep, flu or mono, or precipitating event. Has had both Covid vaccines. Has tried benadryl, zicam, and nyquil without relief. Denies previous symptoms in the past.     Denies fever, chills, ear pain, sinus pain, rhinorrhea,  SOB, wheezing, chest pain, nausea, rash, changes in bowel or bladder habits.    ROS: As per HPI.  All other pertinent ROS negative.    Past Medical History:  Diagnosis Date  . Abdominal pain   . Arthritis   . Biliary dyskinesia   . Colon polyp   . Cyst    right  thyroid  . FH: colonic polyps   . Fibroadenoma   . Hemorrhoids   . Hiatal hernia   . Hyperlipidemia   . IBS (irritable bowel syndrome)   . Insomnia   . Iron deficiency   . Liver cyst   . Migraine   . Mononucleosis   . Neck pain   . Thyroid nodule   . Wears glasses    Past Surgical  History:  Procedure Laterality Date  . CESAREAN SECTION     x2  . CHOLECYSTECTOMY  12/01/11  . ENDOMETRIAL ABLATION  2010  . fibroadenoma removal  1976   left    No Known Allergies No current facility-administered medications on file prior to encounter.   Current Outpatient Medications on File Prior to Encounter  Medication Sig Dispense Refill  . atorvastatin (LIPITOR) 20 MG tablet TAKE 1 TABLET BY MOUTH EVERY DAY 30 tablet 1  . calcium-vitamin D (OSCAL WITH D) 500-200 MG-UNIT tablet Take 1 tablet by mouth.    Marland Kitchen ibuprofen (ADVIL,MOTRIN) 200 MG tablet Take 800 mg by mouth 3 (three) times daily as needed (pain).    . LORazepam (ATIVAN) 0.5 MG tablet TAKE 1 TABLET BY MOUTH EVERY DAY 30 tablet 2  . Multiple Vitamins-Minerals (AIRBORNE PO) Take by mouth.    . Probiotic Product (PROBIOTIC DAILY PO) Take by mouth.    . Vitamin D, Ergocalciferol, (DRISDOL) 1.25 MG (50000 UT) CAPS capsule Take 1 capsule (50,000 Units total) by mouth every 7 (seven) days. (Patient not taking: Reported on 11/12/2019) 12 capsule 0   Social History   Socioeconomic History  . Marital status: Married    Spouse name: Not on file  . Number of children: Not on file  . Years of education: Not on file  . Highest education level: Not on file  Occupational History  . Not on file  Tobacco Use  . Smoking status: Former Smoker    Quit date: 02/05/1984    Years since quitting: 36.0  . Smokeless tobacco: Never Used  . Tobacco comment: 31yrs ago quit  Substance and Sexual Activity  . Alcohol use: No  . Drug use: No  . Sexual activity: Not on file  Other Topics Concern  . Not on file  Social History Narrative  . Not on file   Social Determinants of Health   Financial Resource Strain:   . Difficulty of Paying Living Expenses:   Food Insecurity:   . Worried About Charity fundraiser in the Last Year:   . Arboriculturist in the Last Year:   Transportation Needs:   . Film/video editor (Medical):   Marland Kitchen Lack of  Transportation (Non-Medical):   Physical Activity:   . Days of Exercise per Week:   . Minutes of Exercise per Session:   Stress:   . Feeling of Stress :   Social Connections:   . Frequency of Communication with Friends and Family:   . Frequency of Social Gatherings with Friends and Family:   . Attends Religious Services:   . Active Member of Clubs or Organizations:   . Attends Archivist Meetings:   Marland Kitchen Marital Status:   Intimate Partner Violence:   . Fear of Current or Ex-Partner:   . Emotionally Abused:   Marland Kitchen Physically Abused:   . Sexually Abused:    Family History  Problem Relation Age of Onset  . Hypertension Mother   . Kidney disease Mother   . Hypertension Father   . Cancer Father        colon  . Diabetes Father   . Cancer Sister        Breast     OBJECTIVE:   There were no vitals filed for this visit.  General appearance: alert; no distress Eyes: EOMI grossly HENT: normocephalic; atraumatic Neck: supple with FROM Lungs: normal respiratory effort; speaking in full sentences without difficulty Extremities: moves extremities without difficulty Skin: No obvious rashes Neurologic: No facial asymmetries Psychological: alert and cooperative; normal mood and affect  ASSESSMENT & PLAN:  1. Sinus pressure   2. Frontal sinus pain   3. Nasal congestion   4. Cough     Meds ordered this encounter  Medications  . amoxicillin (AMOXIL) 875 MG tablet    Sig: Take 1 tablet (875 mg total) by mouth 2 (two) times daily for 10 days.    Dispense:  20 tablet    Refill:  0    Order Specific Question:   Supervising Provider    Answer:   Chase Picket [id number]  . predniSONE (STERAPRED UNI-PAK 21 TAB) 10 MG (21) TBPK tablet    Sig: Take by mouth daily for 6 days. Take 6 tablets on day 1, 5 tablets on day 2, 4 tablets on day 3, 3 tablets on day 4, 2 tablets on day 5, 1 tablet on day 6    Dispense:  21 tablet    Refill:  0    Order Specific Question:   Supervising  Provider    Answer:   Chase Picket [id number]  . HYDROcodone-homatropine (HYCODAN) 5-1.5 MG/5ML syrup    Sig: Take 5 mLs by mouth every 6 (six) hours as needed for cough.    Dispense:  120 mL    Refill:  0    Order Specific Question:  Supervising Provider    Answer:   Chase Picket [id number]     Push fluids and get rest Prescribed amoxicillin 875mg  twice daily for 10 days. Take as directed and to completion.  Prescribed prednisone taper Prescribed Hycodan cough syrup Drink warm or cool liquids, use throat lozenges, or popsicles to help alleviate symptoms Take OTC ibuprofen or tylenol as needed for pain Follow up with PCP if symptoms persist Return or go to ER if you have any new or worsening symptoms such as fever, chills, nausea, vomiting, worsening sore throat, cough, abdominal pain, chest pain, changes in bowel or bladder habits,  I discussed the assessment and treatment plan with the patient. The patient was provided an opportunity to ask questions and all were answered. The patient agreed with the plan and demonstrated an understanding of the instructions.   The patient was advised to call back or seek an in-person evaluation if the symptoms worsen or if the condition fails to improve as anticipated.  I provided 10 minutes of non-face-to-face time during this encounter.  Bettey Mare, NP  01/28/2020 5:48 PM         Faustino Congress, NP 01/28/20 [phone]

## 2020-02-06 ENCOUNTER — Other Ambulatory Visit: Payer: Self-pay

## 2020-02-06 ENCOUNTER — Ambulatory Visit
Admission: RE | Admit: 2020-02-06 | Discharge: 2020-02-06 | Disposition: A | Discharge status: Home / Self Care | Payer: Medicare Other | Source: Ambulatory Visit | Attending: Obstetrics and Gynecology | Admitting: Obstetrics and Gynecology

## 2020-02-06 DIAGNOSIS — R928 Other abnormal and inconclusive findings on diagnostic imaging of breast: Secondary | ICD-10-CM

## 2020-02-06 DIAGNOSIS — N6001 Solitary cyst of right breast: Secondary | ICD-10-CM | POA: Diagnosis not present

## 2020-02-29 ENCOUNTER — Other Ambulatory Visit: Payer: Self-pay | Admitting: Family Medicine

## 2020-03-02 NOTE — Telephone Encounter (Signed)
Last OV 05/02/19 Lorazepam last filled 11/28/19 #30 with 2

## 2020-05-29 ENCOUNTER — Other Ambulatory Visit: Payer: Self-pay | Admitting: Family Medicine

## 2020-05-31 ENCOUNTER — Other Ambulatory Visit: Payer: Self-pay | Admitting: Family Medicine

## 2020-06-01 NOTE — Telephone Encounter (Signed)
Last OV 05/02/19 Lorazepam last filled 03/02/20 #30 with 2

## 2020-06-03 ENCOUNTER — Telehealth: Payer: Self-pay | Admitting: Family Medicine

## 2020-06-03 ENCOUNTER — Encounter: Payer: Self-pay | Admitting: General Practice

## 2020-06-03 MED ORDER — ATORVASTATIN CALCIUM 20 MG PO TABS: 20.0000 mg | ORAL_TABLET | Freq: Every day | ORAL | 0 refills | Status: DC

## 2020-06-03 NOTE — Telephone Encounter (Signed)
Since pt has not been seen in over a year, she can have 30 pills to allow here to either schedule appt w/ me or to find new provider locally

## 2020-06-03 NOTE — Telephone Encounter (Signed)
Pt called in asking for a new script of the Atorvastatin be sent into the [facility] in [place]. She states she will be out of this medication on Friday.   Please advise

## 2020-06-03 NOTE — Telephone Encounter (Signed)
Medication filled to pharmacy as requested.  Mychart message sent to advise pt she needs an appt.

## 2020-06-03 NOTE — Telephone Encounter (Signed)
Please advise. Pt has not been seen in over a year.  ?

## 2020-06-09 DIAGNOSIS — M545 Low back pain, unspecified: Secondary | ICD-10-CM | POA: Diagnosis not present

## 2020-06-28 ENCOUNTER — Other Ambulatory Visit: Payer: Self-pay | Admitting: Family Medicine

## 2020-07-02 ENCOUNTER — Ambulatory Visit (INDEPENDENT_AMBULATORY_CARE_PROVIDER_SITE_OTHER): Payer: Medicare Other | Admitting: Family Medicine

## 2020-07-02 ENCOUNTER — Other Ambulatory Visit: Payer: Self-pay

## 2020-07-02 ENCOUNTER — Encounter: Payer: Self-pay | Admitting: Family Medicine

## 2020-07-02 VITALS — BP 122/78 | HR 63 | Temp 97.8°F | Resp 17 | Wt 146.8 lb

## 2020-07-02 DIAGNOSIS — G4700 Insomnia, unspecified: Secondary | ICD-10-CM | POA: Diagnosis not present

## 2020-07-02 DIAGNOSIS — M25559 Pain in unspecified hip: Secondary | ICD-10-CM | POA: Diagnosis not present

## 2020-07-02 DIAGNOSIS — E785 Hyperlipidemia, unspecified: Secondary | ICD-10-CM | POA: Diagnosis not present

## 2020-07-02 DIAGNOSIS — Z1159 Encounter for screening for other viral diseases: Secondary | ICD-10-CM | POA: Diagnosis not present

## 2020-07-02 DIAGNOSIS — E663 Overweight: Secondary | ICD-10-CM

## 2020-07-02 LAB — CBC WITH DIFFERENTIAL/PLATELET
Basophils Absolute: 0 10*3/uL (ref 0.0–0.1)
Basophils Relative: 0.5 % (ref 0.0–3.0)
Eosinophils Absolute: 0.2 10*3/uL (ref 0.0–0.7)
Eosinophils Relative: 3.4 % (ref 0.0–5.0)
HCT: 38.8 % (ref 36.0–46.0)
Hemoglobin: 13.3 g/dL (ref 12.0–15.0)
Lymphocytes Relative: 33.3 % (ref 12.0–46.0)
Lymphs Abs: 1.7 10*3/uL (ref 0.7–4.0)
MCHC: 34.2 g/dL (ref 30.0–36.0)
MCV: 87.8 fl (ref 78.0–100.0)
Monocytes Absolute: 0.4 10*3/uL (ref 0.1–1.0)
Monocytes Relative: 8 % (ref 3.0–12.0)
Neutro Abs: 2.9 10*3/uL (ref 1.4–7.7)
Neutrophils Relative %: 54.8 % (ref 43.0–77.0)
Platelets: 244 10*3/uL (ref 150.0–400.0)
RBC: 4.42 Mil/uL (ref 3.87–5.11)
RDW: 12.9 % (ref 11.5–15.5)
WBC: 5.2 10*3/uL (ref 4.0–10.5)

## 2020-07-02 LAB — BASIC METABOLIC PANEL
BUN: 11 mg/dL (ref 6–23)
CO2: 30 mEq/L (ref 19–32)
Calcium: 9.2 mg/dL (ref 8.4–10.5)
Chloride: 104 mEq/L (ref 96–112)
Creatinine, Ser: 0.6 mg/dL (ref 0.40–1.20)
GFR: 94.05 mL/min (ref >60.00)
Glucose, Bld: 87 mg/dL (ref 70–99)
Potassium: 4.2 mEq/L (ref 3.5–5.1)
Sodium: 140 mEq/L (ref 135–145)

## 2020-07-02 LAB — HEPATIC FUNCTION PANEL
ALT: 22 U/L (ref 0–35)
AST: 13 U/L (ref 0–37)
Albumin: 4.1 g/dL (ref 3.5–5.2)
Alkaline Phosphatase: 69 U/L (ref 39–117)
Bilirubin, Direct: 0.2 mg/dL (ref 0.0–0.3)
Total Bilirubin: 1.4 mg/dL — ABNORMAL HIGH (ref 0.2–1.2)
Total Protein: 6.1 g/dL (ref 6.0–8.3)

## 2020-07-02 LAB — LIPID PANEL
Cholesterol: 165 mg/dL (ref 0–200)
HDL: 58.5 mg/dL (ref >39.00)
LDL Cholesterol: 90 mg/dL (ref 0–99)
NonHDL: 106.09
Total CHOL/HDL Ratio: 3
Triglycerides: 81 mg/dL (ref 0.0–149.0)
VLDL: 16.2 mg/dL (ref 0.0–40.0)

## 2020-07-02 MED ORDER — LORAZEPAM 0.5 MG PO TABS
0.5000 mg | ORAL_TABLET | Freq: Every day | ORAL | 2 refills | Status: DC
Start: 2020-07-02 — End: 2020-07-03

## 2020-07-02 MED ORDER — ATORVASTATIN CALCIUM 20 MG PO TABS
20.0000 mg | ORAL_TABLET | Freq: Every day | ORAL | 1 refills | Status: DC
Start: 2020-07-02 — End: 2020-07-03

## 2020-07-02 MED ORDER — LIDOCAINE 5 % EX CREA: 1.0000 "application " | TOPICAL_CREAM | Freq: Three times a day (TID) | CUTANEOUS | 3 refills | Status: DC | PRN

## 2020-07-02 NOTE — Progress Notes (Signed)
   Subjective:    Patient ID: Crystal [name], female    DOB: Aug 31, 1956, 64 y.o.   MRN: [id number]  HPI Hyperlipidemia- chronic problem, on Lipitor 20mg  daily.  No CP, SOB, abd pain, N/V  Overweight- pt has gained 4 lbs since last visit.  Limited exercise due to back pain.  'so much sciatica'- pt reports it will switch sides.  She states she is not doing her PT exercises.  Saw Dr Veverly Fells and had xrays.  Did not take Prednisone taper b/c she was getting her COVID booster.  Scheduled w/ chiropractor b/c 'it feels like my hips are out of alignment' but cancelled b/c she was feeling better.  Now again symptomatic.  Having sxs today b/c she took care of 30 lb grandson yesterday.  Pt reports she can 'hear it' when she is walking.  Insomnia- pt reports she is able to sleep through the night when taking Advil PM.  If she doesn't take the medication she is up ~3x/night to use the restroom.  Unable to sleep if she doesn't take her Lorazepam, 'I just lay there'.  Has tried Unisom, Jones Skene.     Review of Systems For ROS see HPI   This visit occurred during the SARS-CoV-2 public health emergency.  Safety protocols were in place, including screening questions prior to the visit, additional usage of staff PPE, and extensive cleaning of exam room while observing appropriate contact time as indicated for disinfecting solutions.       Objective:   Physical Exam Vitals reviewed.  Constitutional:      General: She is not in acute distress.    Appearance: Normal appearance. She is well-developed. She is obese.  HENT:     Head: Normocephalic and atraumatic.  Eyes:     Conjunctiva/sclera: Conjunctivae normal.     Pupils: Pupils are equal, round, and reactive to light.  Neck:     Thyroid: No thyromegaly.  Cardiovascular:     Rate and Rhythm: Normal rate and regular rhythm.     Heart sounds: Normal heart sounds. No murmur heard.   Pulmonary:     Effort: Pulmonary effort is normal. No respiratory  distress.     Breath sounds: Normal breath sounds.  Abdominal:     General: There is no distension.     Palpations: Abdomen is soft.     Tenderness: There is no abdominal tenderness.  Musculoskeletal:     Cervical back: Normal range of motion and neck supple.     Right lower leg: No edema.     Left lower leg: No edema.  Lymphadenopathy:     Cervical: No cervical adenopathy.  Skin:    General: Skin is warm and dry.  Neurological:     Mental Status: She is alert and oriented to person, place, and time.  Psychiatric:        Mood and Affect: Mood normal.        Behavior: Behavior normal.        Thought Content: Thought content normal.           Assessment & Plan:  Hip/back pain- new to provider, ongoing for pt.  Sxs will flare and then improve and then flare again.  Pt feels hips are 'out of alignment'.  Encouraged her to f/u w/ chiropractor and if needed, we can refer.  Pt expressed understanding and is in agreement w/ plan.

## 2020-07-02 NOTE — Patient Instructions (Addendum)
Schedule your complete physical in 6 months We'll notify you of your lab results and make any changes if needed Continue to work on healthy diet and regular exercise- you look great! Call and schedule with a Chiropractor to check your alignment Call and ask Dr Collene Mares about colonoscopy Continue the Lorazepam and Advil PM for sleep Call with any questions or concerns Stay Safe!  Stay Healthy!!

## 2020-07-03 ENCOUNTER — Encounter: Payer: Self-pay | Admitting: General Practice

## 2020-07-03 ENCOUNTER — Encounter: Payer: Self-pay | Admitting: Family Medicine

## 2020-07-03 LAB — HEPATITIS C ANTIBODY
Hepatitis C Ab: NONREACTIVE
SIGNAL TO CUT-OFF: 0.01 (ref <1.00)

## 2020-07-03 MED ORDER — LORAZEPAM 0.5 MG PO TABS
0.5000 mg | ORAL_TABLET | Freq: Every day | ORAL | 2 refills | Status: DC
Start: 2020-07-03 — End: 2020-10-06

## 2020-07-03 MED ORDER — ATORVASTATIN CALCIUM 20 MG PO TABS
20.0000 mg | ORAL_TABLET | Freq: Every day | ORAL | 1 refills | Status: DC
Start: 2020-07-03 — End: 2021-05-07

## 2020-07-03 MED ORDER — LIDOCAINE 5 % EX CREA
1.0000 | TOPICAL_CREAM | Freq: Three times a day (TID) | CUTANEOUS | 3 refills | Status: DC | PRN
Start: 2020-07-03 — End: 2021-10-25

## 2020-07-14 DIAGNOSIS — M9903 Segmental and somatic dysfunction of lumbar region: Secondary | ICD-10-CM | POA: Diagnosis not present

## 2020-07-14 DIAGNOSIS — M9905 Segmental and somatic dysfunction of pelvic region: Secondary | ICD-10-CM | POA: Diagnosis not present

## 2020-07-14 DIAGNOSIS — M9902 Segmental and somatic dysfunction of thoracic region: Secondary | ICD-10-CM | POA: Diagnosis not present

## 2020-07-14 DIAGNOSIS — M41126 Adolescent idiopathic scoliosis, lumbar region: Secondary | ICD-10-CM | POA: Diagnosis not present

## 2020-07-14 NOTE — Assessment & Plan Note (Signed)
Chronic problem.  On Lipitor 20mg daily w/o difficulty.  Check labs.  Adjust meds prn  

## 2020-07-14 NOTE — Assessment & Plan Note (Signed)
Chronic problem.  She has to take Advil PM in order to sleep- even when she takes the Lorazepam.  She is not interested in prescription sleep aid due to side effects.  No changes at this time.

## 2020-07-14 NOTE — Assessment & Plan Note (Signed)
Ongoing issue for pt.  She has gained 4 lbs since last visit.  She has not been able to exercise recently due to back pain.  Encouraged healthy diet and activity as able.  Check labs to risk stratify.  Will follow.

## 2020-07-15 DIAGNOSIS — M9903 Segmental and somatic dysfunction of lumbar region: Secondary | ICD-10-CM | POA: Diagnosis not present

## 2020-07-15 DIAGNOSIS — M9902 Segmental and somatic dysfunction of thoracic region: Secondary | ICD-10-CM | POA: Diagnosis not present

## 2020-07-15 DIAGNOSIS — M9905 Segmental and somatic dysfunction of pelvic region: Secondary | ICD-10-CM | POA: Diagnosis not present

## 2020-07-16 DIAGNOSIS — M9903 Segmental and somatic dysfunction of lumbar region: Secondary | ICD-10-CM | POA: Diagnosis not present

## 2020-07-16 DIAGNOSIS — M9905 Segmental and somatic dysfunction of pelvic region: Secondary | ICD-10-CM | POA: Diagnosis not present

## 2020-07-16 DIAGNOSIS — M9902 Segmental and somatic dysfunction of thoracic region: Secondary | ICD-10-CM | POA: Diagnosis not present

## 2020-07-17 DIAGNOSIS — M9905 Segmental and somatic dysfunction of pelvic region: Secondary | ICD-10-CM | POA: Diagnosis not present

## 2020-07-17 DIAGNOSIS — M9902 Segmental and somatic dysfunction of thoracic region: Secondary | ICD-10-CM | POA: Diagnosis not present

## 2020-07-17 DIAGNOSIS — M9903 Segmental and somatic dysfunction of lumbar region: Secondary | ICD-10-CM | POA: Diagnosis not present

## 2020-07-20 DIAGNOSIS — M9903 Segmental and somatic dysfunction of lumbar region: Secondary | ICD-10-CM | POA: Diagnosis not present

## 2020-07-20 DIAGNOSIS — M9905 Segmental and somatic dysfunction of pelvic region: Secondary | ICD-10-CM | POA: Diagnosis not present

## 2020-07-20 DIAGNOSIS — M9902 Segmental and somatic dysfunction of thoracic region: Secondary | ICD-10-CM | POA: Diagnosis not present

## 2020-07-21 DIAGNOSIS — M9905 Segmental and somatic dysfunction of pelvic region: Secondary | ICD-10-CM | POA: Diagnosis not present

## 2020-07-21 DIAGNOSIS — M9903 Segmental and somatic dysfunction of lumbar region: Secondary | ICD-10-CM | POA: Diagnosis not present

## 2020-07-21 DIAGNOSIS — M9902 Segmental and somatic dysfunction of thoracic region: Secondary | ICD-10-CM | POA: Diagnosis not present

## 2020-07-22 DIAGNOSIS — M9905 Segmental and somatic dysfunction of pelvic region: Secondary | ICD-10-CM | POA: Diagnosis not present

## 2020-07-22 DIAGNOSIS — M9902 Segmental and somatic dysfunction of thoracic region: Secondary | ICD-10-CM | POA: Diagnosis not present

## 2020-07-22 DIAGNOSIS — M5441 Lumbago with sciatica, right side: Secondary | ICD-10-CM | POA: Diagnosis not present

## 2020-07-22 DIAGNOSIS — M9903 Segmental and somatic dysfunction of lumbar region: Secondary | ICD-10-CM | POA: Diagnosis not present

## 2020-07-22 DIAGNOSIS — M41126 Adolescent idiopathic scoliosis, lumbar region: Secondary | ICD-10-CM | POA: Diagnosis not present

## 2020-07-23 DIAGNOSIS — M41126 Adolescent idiopathic scoliosis, lumbar region: Secondary | ICD-10-CM | POA: Diagnosis not present

## 2020-07-23 DIAGNOSIS — M9903 Segmental and somatic dysfunction of lumbar region: Secondary | ICD-10-CM | POA: Diagnosis not present

## 2020-07-23 DIAGNOSIS — M9902 Segmental and somatic dysfunction of thoracic region: Secondary | ICD-10-CM | POA: Diagnosis not present

## 2020-07-23 DIAGNOSIS — M9905 Segmental and somatic dysfunction of pelvic region: Secondary | ICD-10-CM | POA: Diagnosis not present

## 2020-07-23 DIAGNOSIS — M5441 Lumbago with sciatica, right side: Secondary | ICD-10-CM | POA: Diagnosis not present

## 2020-07-24 DIAGNOSIS — M5441 Lumbago with sciatica, right side: Secondary | ICD-10-CM | POA: Diagnosis not present

## 2020-07-24 DIAGNOSIS — M9905 Segmental and somatic dysfunction of pelvic region: Secondary | ICD-10-CM | POA: Diagnosis not present

## 2020-07-24 DIAGNOSIS — M9902 Segmental and somatic dysfunction of thoracic region: Secondary | ICD-10-CM | POA: Diagnosis not present

## 2020-07-24 DIAGNOSIS — M9903 Segmental and somatic dysfunction of lumbar region: Secondary | ICD-10-CM | POA: Diagnosis not present

## 2020-08-27 DIAGNOSIS — U071 COVID-19: Secondary | ICD-10-CM | POA: Diagnosis not present

## 2020-09-23 DIAGNOSIS — N644 Mastodynia: Secondary | ICD-10-CM | POA: Diagnosis not present

## 2020-10-05 ENCOUNTER — Other Ambulatory Visit: Payer: Self-pay | Admitting: Family Medicine

## 2020-10-05 NOTE — Telephone Encounter (Signed)
Lorazepam last rx 07/03/20 #30 2 RF LOV: 07/02/20 Chol

## 2020-10-21 ENCOUNTER — Telehealth: Payer: Self-pay | Admitting: Internal Medicine

## 2020-10-21 NOTE — Telephone Encounter (Signed)
Pt c/o of Chest Pain: STAT if CP now or developed within 24 hours  1. Are you having CP right now? Tightness in chest- not at this time  2. Are you experiencing any other symptoms (ex. SOB, nausea, vomiting, sweating)?no  3. How long have you been experiencing CP? About a month- since she had Covid  4. Is your CP continuous or coming and going?  Comes and goes  5. Have you taken Nitroglycerin? No- I made her an appt on 10-28-22 with Fabian Sharp?

## 2020-10-21 NOTE — Telephone Encounter (Signed)
Returned call to patient she stated she was unable to talk at present.Stated she was in a mandatory zoom meeting.Stated she will call back at 1:00 pm.

## 2020-10-21 NOTE — Telephone Encounter (Signed)
Spoke to patient she stated since she had covid 09/05/20 she has been having chest pain and tightness.No chest pain at present.Appointment scheduled with Coletta Memos NP 2/21 at 3:15 pm.Advised to go to ED if needed.

## 2020-10-25 NOTE — Progress Notes (Signed)
Cardiology Clinic Note   Patient Name: Crystal [name] Date of Encounter: 10/26/2020  Primary Care Provider:  Midge Minium, MD Primary Cardiologist:  Pixie Casino, MD  Patient Profile    Crystal [name] 65 year old female presents the clinic today for evaluation of chest pain/tightness.  Past Medical History    Past Medical History:  Diagnosis Date  . Abdominal pain   . Arthritis   . Biliary dyskinesia   . Colon polyp   . Cyst    right  thyroid  . FH: colonic polyps   . Fibroadenoma   . Hemorrhoids   . Hiatal hernia   . Hyperlipidemia   . IBS (irritable bowel syndrome)   . Insomnia   . Iron deficiency   . Liver cyst   . Migraine   . Mononucleosis   . Neck pain   . Thyroid nodule   . Wears glasses    Past Surgical History:  Procedure Laterality Date  . CESAREAN SECTION     x2  . CHOLECYSTECTOMY  12/01/11  . ENDOMETRIAL ABLATION  2010  . fibroadenoma removal  1976   left     Allergies  No Known Allergies  History of Present Illness    Crystal [name] has a PMH of tachycardia, arthritis, IBS, insomnia, migraines, and iron deficiency.  She was seen by Dr. Debara Pickett on 11/12/2019.  She was referred by her PCP for evaluation of her tachycardia.  Her husband is also a patient of Dr. Lysbeth Penner.  Her tachycardia was paroxysmal.  She described it as a racing heart rate at times.  She noted that if she would cough her symptoms would resolve.  She reported presence of her symptoms for several days and abscesses for several days.  She previously wore cardiac event monitors which were nonrevealing.  She denied anxiety but did report difficulty sleeping and the use of lorazepam as needed.  The subsequent cardiac event monitor was placed and showed one episode of tachycardia.  She contacted the nurse triage line on 10/21/2020 and noted that since she had had Covid 09/05/2020 she had been having episodes of chest pain and tightness.  She was not having chest pain at the time of  the call.  She presents the clinic today for follow-up evaluation states since having COVID 19 after New Year's 2022 she has noticed increased chest pressure.  She reports that she was very sick with her COVID-19 infection.  She was not hospitalized but was very fatigued and did not leave home for around 10 days.  She also reports that she had bilateral breast pain and reported to her gynecologist who performed breast exam but did not find any abnormal findings.  She reports that she notices increased chest pressure after heavier periods of stress such as work and increase stressful life events.  Her pain is relieved with counterpressure.  She does report that she was in [place] last week to help her son move.  She was able to walk 30 flights of stairs without chest discomfort.  She reports that she was previously seen and evaluated by Dr. Debara Pickett who felt there was a large anxiety component to her chest pain.  She also reports that she is taking Ativan for sleep and has done since the early 2000's.  I will order an echocardiogram, have her maintain her physical activity, give her heart healthy diet information, and have her follow-up in 1 month to review her echo.  Today she denies shortness of  breath, lower extremity edema, fatigue, palpitations, melena, hematuria, hemoptysis, diaphoresis, weakness, presyncope, syncope, orthopnea, and PND.   Home Medications    Prior to Admission medications   Medication Sig Start Date End Date Taking? Authorizing Provider  atorvastatin (LIPITOR) 20 MG tablet Take 1 tablet (20 mg total) by mouth daily. 07/03/20   Midge Minium, MD  calcium-vitamin D (OSCAL WITH D) 500-200 MG-UNIT tablet Take 1 tablet by mouth.    [provider]  ibuprofen (ADVIL,MOTRIN) 200 MG tablet Take 800 mg by mouth 3 (three) times daily as needed (pain).    [provider]  Lidocaine 5 % CREA Apply 1 application topically 3 (three) times daily as needed. 07/03/20    Midge Minium, MD  LORazepam (ATIVAN) 0.5 MG tablet TAKE 1 TABLET BY MOUTH AT BEDTIME. 10/06/20   Midge Minium, MD  Multiple Vitamins-Minerals (AIRBORNE PO) Take by mouth.    [provider]  Probiotic Product (PROBIOTIC DAILY PO) Take by mouth.    [provider]    Family History    Family History  Problem Relation Age of Onset  . Hypertension Mother   . Kidney disease Mother   . Hypertension Father   . Cancer Father        colon  . Diabetes Father   . Cancer Sister        Breast    She indicated that her mother is deceased. She indicated that her father is deceased. She indicated that her sister is alive. She indicated that her brother is alive.  Social History    Social History   Socioeconomic History  . Marital status: Married    Spouse name: Not on file  . Number of children: Not on file  . Years of education: Not on file  . Highest education level: Not on file  Occupational History  . Not on file  Tobacco Use  . Smoking status: Former Smoker    Quit date: 02/05/1984    Years since quitting: 36.7  . Smokeless tobacco: Never Used  . Tobacco comment: 4yrs ago quit  Substance and Sexual Activity  . Alcohol use: No  . Drug use: No  . Sexual activity: Not on file  Other Topics Concern  . Not on file  Social History Narrative  . Not on file   Social Determinants of Health   Financial Resource Strain: Not on file  Food Insecurity: Not on file  Transportation Needs: Not on file  Physical Activity: Not on file  Stress: Not on file  Social Connections: Not on file  Intimate Partner Violence: Not on file     Review of Systems    General:  No chills, fever, night sweats or weight changes.  Cardiovascular:  No chest pain, dyspnea on exertion, edema, orthopnea, palpitations, paroxysmal nocturnal dyspnea. Dermatological: No rash, lesions/masses Respiratory: No cough, dyspnea Urologic: No hematuria, dysuria Abdominal:   No nausea,  vomiting, diarrhea, bright red blood per rectum, melena, or hematemesis Neurologic:  No visual changes, wkns, changes in mental status. All other systems reviewed and are otherwise negative except as noted above.  Physical Exam    VS:  BP 118/74   Pulse 62   Ht 5' 4.5" (1.638 m)   Wt 151 lb 6.4 oz (68.7 kg)   SpO2 98%   BMI 25.59 kg/m  , BMI Body mass index is 25.59 kg/m. GEN: Well nourished, well developed, in no acute distress. HEENT: normal. Neck: Supple, no JVD, carotid bruits,  or masses. Cardiac: RRR, no murmurs, rubs, or gallops. No clubbing, cyanosis, edema.  Radials/DP/PT 2+ and equal bilaterally.  Respiratory:  Respirations regular and unlabored, clear to auscultation bilaterally. GI: Soft, nontender, nondistended, BS + x 4. MS: no deformity or atrophy. Skin: warm and dry, no rash. Neuro:  Strength and sensation are intact. Psych: Normal affect.  Accessory Clinical Findings    Recent Labs: 11/21/2019: TSH 1.19 07/02/2020: ALT 22; BUN 11; Creatinine, Ser 0.60; Hemoglobin 13.3; Platelets 244.0; Potassium 4.2; Sodium 140   Recent Lipid Panel    Component Value Date/Time   CHOL 165 07/02/2020 1120   TRIG 81.0 07/02/2020 1120   HDL 58.50 07/02/2020 1120   CHOLHDL 3 07/02/2020 1120   VLDL 16.2 07/02/2020 1120   LDLCALC 90 07/02/2020 1120    ECG personally reviewed by me today-normal sinus rhythm no ST or T wave deviation 62 bpm- No acute changes  Cardiac event monitor 12/04/2019 Sinus tachycardia-1 event  Assessment & Plan   1.  Chest pressure/DOE -has noted intermittent periods of chest pressure since having COVID-19 infection September 05, 2020.  Continues to be physically active.  Pressure relieved with counterpressure. Heart healthy diet Maintain physical activity as tolerated Order echocardiogram  Tachycardia-EKG today shows normal sinus rhythm no ST or T wave deviation 62 bpm previous cardiac event monitor 12/04/2019 showed one episode of sinus tachycardia.   Has noted fewer episodes of increased/rapid heart rate. Heart healthy diet Maintain physical activity Avoid triggers caffeine, chocolate, EtOH, dehydration etc.  Hyperlipidemia-07/02/2020: Cholesterol 165; HDL 58.50; LDL Cholesterol 90; Triglycerides 81.0; VLDL 16.2  Continue atorvastatin Heart healthy low-sodium high-fiber diet Increase physical activity as tolerated Follows with PCP   Disposition: Follow-up with Dr. Debara Pickett or me in 1 month.   Jossie Ng. Shelitha Magley NP-C    10/26/2020, 3:56 PM [address] Office [phone] Fax [phone]  Notice: This dictation was prepared with Dragon dictation along with smaller phrase technology. Any transcriptional errors that result from this process are unintentional and may not be corrected upon review.  I spent 12 minutes examining this patient, reviewing medications, and using patient centered shared decision making involving her cardiac care.  Prior to her visit I spent greater than 20 minutes reviewing her past medical history,  medications, and prior cardiac tests.

## 2020-10-26 ENCOUNTER — Encounter: Payer: Self-pay | Admitting: General Practice

## 2020-10-26 ENCOUNTER — Other Ambulatory Visit: Payer: Self-pay

## 2020-10-26 ENCOUNTER — Ambulatory Visit: Payer: Medicare Other | Admitting: General Practice

## 2020-10-26 VITALS — BP 118/74 | HR 62 | Ht 64.5 in | Wt 151.4 lb

## 2020-10-26 DIAGNOSIS — R0609 Other forms of dyspnea: Secondary | ICD-10-CM

## 2020-10-26 DIAGNOSIS — I479 Paroxysmal tachycardia, unspecified: Secondary | ICD-10-CM

## 2020-10-26 DIAGNOSIS — E782 Mixed hyperlipidemia: Secondary | ICD-10-CM | POA: Diagnosis not present

## 2020-10-26 DIAGNOSIS — R0789 Other chest pain: Secondary | ICD-10-CM

## 2020-10-26 DIAGNOSIS — R0600 Dyspnea, unspecified: Secondary | ICD-10-CM | POA: Diagnosis not present

## 2020-10-26 NOTE — Patient Instructions (Signed)
Medication Instructions:  The current medical regimen is effective;  continue present plan and medications as directed. Please refer to the Current Medication list given to you today.  *If you need a refill on your cardiac medications before your next appointment, please call your pharmacy*  Lab Work: NONE  Testing/Procedures: Echocardiogram - Your physician has requested that you have an echocardiogram. Echocardiography is a painless test that uses sound waves to create images of your heart. It provides your doctor with information about the size and shape of your heart and how well your heart's chambers and valves are working. This procedure takes approximately one hour. There are no restrictions for this procedure. This will be performed at our [facility] location - [address].  Special Instructions MAKE SURE TO STAY WELL HYDRATED  PLEASE MAINTAIN PHYSICAL ACTIVITY AS TOLERATED  PLEASE READ AND FOLLOW HEART HEALTHY DIET-ATTACHED  Follow-Up: Your next appointment:  AFTER ECHO In Person with K. Mali Hilty, MD OR IF UNAVAILABLE [place], FNP-C  At [facility], you and your health needs are our priority.  As part of our continuing mission to provide you with exceptional heart care, we have created designated Provider Care Teams.  These Care Teams include your primary Cardiologist (physician) and Advanced Practice Providers (APPs -  Physician Assistants and Nurse Practitioners) who all work together to provide you with the care you need, when you need it.  We recommend signing up for the patient portal called "MyChart".  Sign up information is provided on this After Visit Summary.  MyChart is used to connect with patients for Virtual Visits (Telemedicine).  Patients are able to view lab/test results, encounter notes, upcoming appointments, etc.  Non-urgent messages can be sent to your provider as well.   To learn more about what you can do with MyChart, go to  [email or web address].      Heart-Healthy Eating Plan Heart-healthy meal planning includes:  Eating less unhealthy fats.  Eating more healthy fats.  Making other changes in your diet. Talk with your doctor or a diet specialist (dietitian) to create an eating plan that is right for you.  What are tips for following this plan? Cooking Avoid frying your food. Try to bake, boil, grill, or broil it instead. You can also reduce fat by:  Removing the skin from poultry.  Removing all visible fats from meats.  Steaming vegetables in water or broth. Meal planning  At meals, divide your plate into four equal parts: ? Fill one-half of your plate with vegetables and green salads. ? Fill one-fourth of your plate with whole grains. ? Fill one-fourth of your plate with lean protein foods.  Eat 4-5 servings of vegetables per day. A serving of vegetables is: ? 1 cup of raw or cooked vegetables. ? 2 cups of raw leafy greens.  Eat 4-5 servings of fruit per day. A serving of fruit is: ? 1 medium whole fruit. ?  cup of dried fruit. ?  cup of fresh, frozen, or canned fruit. ?  cup of 100% fruit juice.  Eat more foods that have soluble fiber. These are apples, broccoli, carrots, beans, peas, and barley. Try to get 20-30 g of fiber per day.  Eat 4-5 servings of nuts, legumes, and seeds per week: ? 1 serving of dried beans or legumes equals  cup after being cooked. ? 1 serving of nuts is  cup. ? 1 serving of seeds equals 1 tablespoon.   General information  Eat more  home-cooked food. Eat less restaurant, buffet, and fast food.  Limit or avoid alcohol.  Limit foods that are high in starch and sugar.  Avoid fried foods.  Lose weight if you are overweight.  Keep track of how much salt (sodium) you eat. This is important if you have high blood pressure. Ask your doctor to tell you more about this.  Try to add vegetarian meals each week. Fats  Choose healthy fats. These include  olive oil and canola oil, flaxseeds, walnuts, almonds, and seeds.  Eat more omega-3 fats. These include salmon, mackerel, sardines, tuna, flaxseed oil, and ground flaxseeds. Try to eat fish at least 2 times each week.  Check food labels. Avoid foods with trans fats or high amounts of saturated fat.  Limit saturated fats. ? These are often found in animal products, such as meats, butter, and cream. ? These are also found in plant foods, such as palm oil, palm kernel oil, and coconut oil.  Avoid foods with partially hydrogenated oils in them. These have trans fats. Examples are stick margarine, some tub margarines, cookies, crackers, and other baked goods. What foods can I eat? Fruits All fresh, canned (in natural juice), or frozen fruits. Vegetables Fresh or frozen vegetables (raw, steamed, roasted, or grilled). Green salads. Grains Most grains. Choose whole wheat and whole grains most of the time. Rice and pasta, including brown rice and pastas made with whole wheat. Meats and other proteins Lean, well-trimmed beef, veal, pork, and lamb. Chicken and [place] without skin. All fish and shellfish. Wild duck, rabbit, pheasant, and venison. Egg whites or low-cholesterol egg substitutes. Dried beans, peas, lentils, and tofu. Seeds and most nuts. Dairy Low-fat or nonfat cheeses, including ricotta and mozzarella. Skim or 1% milk that is liquid, powdered, or evaporated. Buttermilk that is made with low-fat milk. Nonfat or low-fat yogurt. Fats and oils Non-hydrogenated (trans-free) margarines. Vegetable oils, including soybean, sesame, sunflower, olive, peanut, safflower, corn, canola, and cottonseed. Salad dressings or mayonnaise made with a vegetable oil. Beverages Mineral water. Coffee and tea. Diet carbonated beverages. Sweets and desserts Sherbet, gelatin, and fruit ice. Small amounts of dark chocolate. Limit all sweets and desserts. Seasonings and condiments All seasonings and  condiments. The items listed above may not be a complete list of foods and drinks you can eat. Contact a dietitian for more options. What foods should I avoid? Fruits Canned fruit in heavy syrup. Fruit in cream or butter sauce. Fried fruit. Limit coconut. Vegetables Vegetables cooked in cheese, cream, or butter sauce. Fried vegetables. Grains Breads that are made with saturated or trans fats, oils, or whole milk. Croissants. Sweet rolls. Donuts. High-fat crackers, such as cheese crackers. Meats and other proteins Fatty meats, such as hot dogs, ribs, sausage, bacon, rib-eye roast or steak. High-fat deli meats, such as salami and bologna. Caviar. Domestic duck and goose. Organ meats, such as liver. Dairy Cream, sour cream, cream cheese, and creamed cottage cheese. Whole-milk cheeses. Whole or 2% milk that is liquid, evaporated, or condensed. Whole buttermilk. Cream sauce or high-fat cheese sauce. Yogurt that is made from whole milk. Fats and oils Meat fat, or shortening. Cocoa butter, hydrogenated oils, palm oil, coconut oil, palm kernel oil. Solid fats and shortenings, including bacon fat, salt pork, lard, and butter. Nondairy cream substitutes. Salad dressings with cheese or sour cream. Beverages Regular sodas and juice drinks with added sugar. Sweets and desserts Frosting. Pudding. Cookies. Cakes. Pies. Milk chocolate or white chocolate. Buttered syrups. Full-fat ice cream or ice cream  drinks. The items listed above may not be a complete list of foods and drinks to avoid. Contact a dietitian for more information. Summary  Heart-healthy meal planning includes eating less unhealthy fats, eating more healthy fats, and making other changes in your diet.  Eat a balanced diet. This includes fruits and vegetables, low-fat or nonfat dairy, lean protein, nuts and legumes, whole grains, and heart-healthy oils and fats. This information is not intended to replace advice given to you by your health  care provider. Make sure you discuss any questions you have with your health care provider. Document Revised: 10/26/2017 Document Reviewed: 09/29/2017 Elsevier Patient Education  2021 [employer].

## 2020-10-28 ENCOUNTER — Ambulatory Visit: Payer: Medicare Other | Admitting: Physician Assistant

## 2020-11-18 ENCOUNTER — Other Ambulatory Visit: Payer: Self-pay

## 2020-11-18 ENCOUNTER — Ambulatory Visit (HOSPITAL_COMMUNITY): Payer: Medicare Other | Attending: Cardiology

## 2020-11-18 DIAGNOSIS — R0600 Dyspnea, unspecified: Secondary | ICD-10-CM | POA: Diagnosis not present

## 2020-11-18 DIAGNOSIS — R0609 Other forms of dyspnea: Secondary | ICD-10-CM

## 2020-11-19 LAB — ECHOCARDIOGRAM COMPLETE
Area-P 1/2: 4.8 cm2
P 1/2 time: 619 msec
S' Lateral: 1.8 cm

## 2020-11-24 ENCOUNTER — Telehealth: Payer: Self-pay | Admitting: Family Medicine

## 2020-11-26 NOTE — Telephone Encounter (Signed)
Attempted to schedule AWV. Unable to LVM.  Will try at later time.  

## 2020-12-01 ENCOUNTER — Ambulatory Visit: Payer: Medicare Other | Admitting: Internal Medicine

## 2020-12-01 ENCOUNTER — Encounter: Payer: Self-pay | Admitting: Internal Medicine

## 2020-12-01 ENCOUNTER — Other Ambulatory Visit: Payer: Self-pay

## 2020-12-01 VITALS — BP 116/71 | HR 88 | Ht 63.0 in | Wt 147.6 lb

## 2020-12-01 DIAGNOSIS — I479 Paroxysmal tachycardia, unspecified: Secondary | ICD-10-CM | POA: Diagnosis not present

## 2020-12-01 DIAGNOSIS — R0789 Other chest pain: Secondary | ICD-10-CM | POA: Diagnosis not present

## 2020-12-01 NOTE — Patient Instructions (Signed)

## 2020-12-01 NOTE — Progress Notes (Signed)
OFFICE CONSULT NOTE  Chief Complaint:  Tachycardia  Primary Care Physician: Crystal Minium, MD  HPI:  Crystal [name] is a 65 y.o. female who is being seen today for the evaluation of tachycardia at the request of Tabori, Crystal Millet, MD.  Crystal [name] is a pleasant 65 year old female whose husband has been a patient of mine.  Recently she has been having issues with recurrent tachycardia.  This is paroxysmal and not described by her as palpitations rather she notes her heart racing at times and she feels that if she coughs that the symptoms resolved.  It tends to come in paroxysms and can be present for several days and then not for several days or weeks.  In the past has been evaluated both by cardiology and her primary provider with regards to these.  She is worn monitors it sounds like between 24 and 72 hours which have not been revealing.  She denies any anxiety but does report difficulty sleeping which has been a problem for years for which she takes lorazepam.  12/01/2020  Crystal [name] returns today for follow-up. She had seen Crystal Memos, NP for some chest pain in the interim since I saw her.  She wore a monitor which showed a brief episode of sinus tachycardia but no significant arrhythmias.  She had an echocardiogram and that showed no significant abnormal findings.  In general she tends to be improving and she says now she feels almost back to normal.  She wonders if it may have been related to COVID-19 infection.  PMHx:  Past Medical History:  Diagnosis Date  . Abdominal pain   . Arthritis   . Biliary dyskinesia   . Colon polyp   . Cyst    right  thyroid  . FH: colonic polyps   . Fibroadenoma   . Hemorrhoids   . Hiatal hernia   . Hyperlipidemia   . IBS (irritable bowel syndrome)   . Insomnia   . Iron deficiency   . Liver cyst   . Migraine   . Mononucleosis   . Neck pain   . Thyroid nodule   . Wears glasses     Past Surgical History:  Procedure Laterality  Date  . CESAREAN SECTION     x2  . CHOLECYSTECTOMY  12/01/11  . ENDOMETRIAL ABLATION  2010  . fibroadenoma removal  1976   left     FAMHx:  Family History  Problem Relation Age of Onset  . Hypertension Mother   . Kidney disease Mother   . Hypertension Father   . Cancer Father        colon  . Diabetes Father   . Cancer Sister        Breast     SOCHx:   reports that she quit smoking about 36 years ago. She has never used smokeless tobacco. She reports that she does not drink alcohol and does not use drugs.  ALLERGIES:  No Known Allergies  ROS: Pertinent items noted in HPI and remainder of comprehensive ROS otherwise negative.  HOME MEDS: Current Outpatient Medications on File Prior to Visit  Medication Sig Dispense Refill  . atorvastatin (LIPITOR) 20 MG tablet Take 1 tablet (20 mg total) by mouth daily. 90 tablet 1  . ibuprofen (ADVIL,MOTRIN) 200 MG tablet Take 800 mg by mouth 3 (three) times daily as needed (pain).    . Lidocaine 5 % CREA Apply 1 application topically 3 (three) times daily as needed. 30 g 3  .  LORazepam (ATIVAN) 0.5 MG tablet TAKE 1 TABLET BY MOUTH AT BEDTIME. 30 tablet 2  . Multiple Vitamins-Minerals (AIRBORNE PO) Take by mouth.    . Probiotic Product (PROBIOTIC DAILY PO) Take by mouth.     No current facility-administered medications on file prior to visit.    LABS/IMAGING: No results found for this or any previous visit (from the past 48 hour(s)). No results found.  LIPID PANEL:    Component Value Date/Time   CHOL 165 07/02/2020 1120   TRIG 81.0 07/02/2020 1120   HDL 58.50 07/02/2020 1120   CHOLHDL 3 07/02/2020 1120   VLDL 16.2 07/02/2020 1120   LDLCALC 90 07/02/2020 1120    WEIGHTS: Wt Readings from Last 3 Encounters:  12/01/20 147 lb 9.6 oz (67 kg)  10/26/20 151 lb 6.4 oz (68.7 kg)  07/02/20 146 lb 12.8 oz (66.6 kg)    VITALS: BP 116/71   Pulse 88   Ht 5\' 3"  (1.6 m)   Wt 147 lb 9.6 oz (67 kg)   SpO2 95%   BMI 26.15 kg/m    EXAM: Deferred  EKG: Deferred  ASSESSMENT: 1. Paroxysmal tachycardia 2. Chest wall pain  PLAN: 1.   [name] had describes somewhat of an atypical chest wall pain which was present since she had COVID-19.  It is finally now resolved.  Her echo was reassuring.  Her monitor showed only some sinus tachycardia.  I think all this is very reassuring.  I would not recommend any further testing at this time.  Plan follow-up with me as needed.  Pixie Casino, MD, [facility], [place] Director of the Advanced Lipid Disorders &  Cardiovascular Risk Reduction Clinic Diplomate of the American Board of Clinical Lipidology Attending Cardiologist  Direct Dial: [phone]  Fax: [phone]  Website:  [email or web address] 12/01/2020, 3:45 PM

## 2021-01-01 ENCOUNTER — Other Ambulatory Visit: Payer: Self-pay | Admitting: Family Medicine

## 2021-01-01 NOTE — Telephone Encounter (Signed)
LFD 10/06/20 #30 with 2 refills LOV 07/02/20 NOV none

## 2021-01-20 DIAGNOSIS — H52203 Unspecified astigmatism, bilateral: Secondary | ICD-10-CM | POA: Diagnosis not present

## 2021-01-20 DIAGNOSIS — H2513 Age-related nuclear cataract, bilateral: Secondary | ICD-10-CM | POA: Diagnosis not present

## 2021-01-20 DIAGNOSIS — H524 Presbyopia: Secondary | ICD-10-CM | POA: Diagnosis not present

## 2021-01-20 DIAGNOSIS — H5203 Hypermetropia, bilateral: Secondary | ICD-10-CM | POA: Diagnosis not present

## 2021-01-25 DIAGNOSIS — Z13220 Encounter for screening for lipoid disorders: Secondary | ICD-10-CM | POA: Diagnosis not present

## 2021-01-25 DIAGNOSIS — Z1231 Encounter for screening mammogram for malignant neoplasm of breast: Secondary | ICD-10-CM | POA: Diagnosis not present

## 2021-01-25 DIAGNOSIS — Z13228 Encounter for screening for other metabolic disorders: Secondary | ICD-10-CM | POA: Diagnosis not present

## 2021-01-25 LAB — HM MAMMOGRAPHY

## 2021-03-01 ENCOUNTER — Other Ambulatory Visit: Payer: Self-pay | Admitting: Family Medicine

## 2021-03-03 ENCOUNTER — Encounter: Payer: Self-pay | Admitting: *Deleted

## 2021-03-07 IMAGING — US US BREAST*R* LIMITED INC AXILLA
1 series · 6 of 6 positions shown · non-contrast
Comparison: Previous exam(s).

CLINICAL DATA: Patient recalled from screening for right breast
mass.

EXAM:
ULTRASOUND OF THE RIGHT BREAST

[Series 1: us breast*right* limited inc axilla · 0.06mm/px · 6 of 6 slices shown]
[im 1/6]
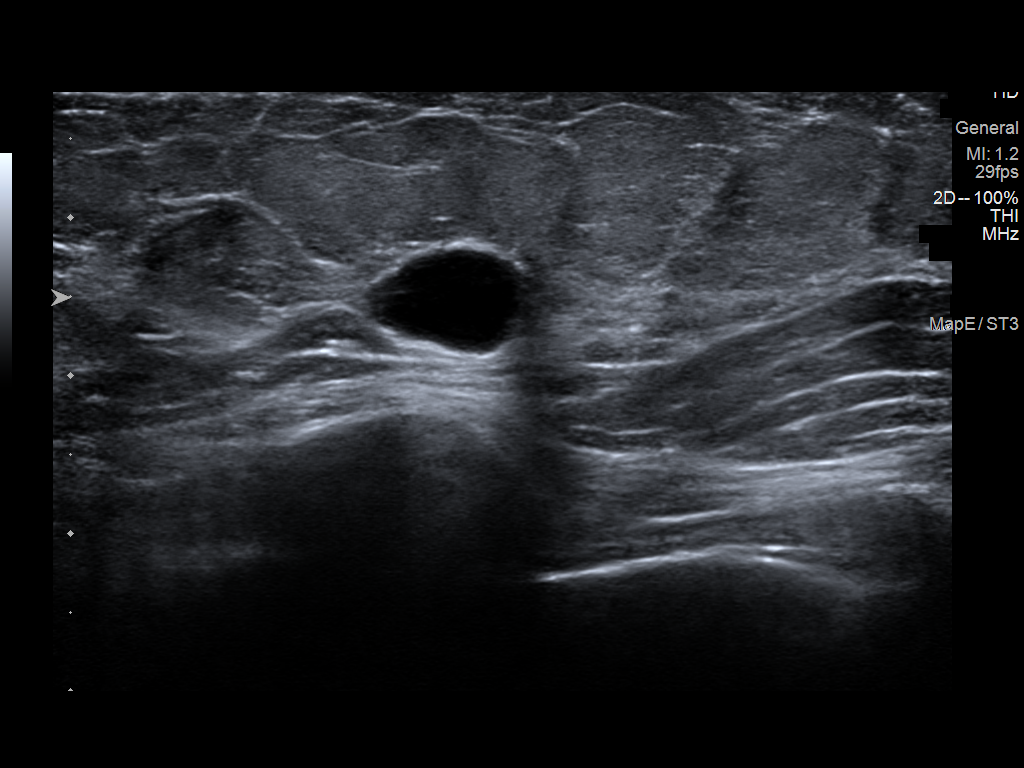
[im 2/6]
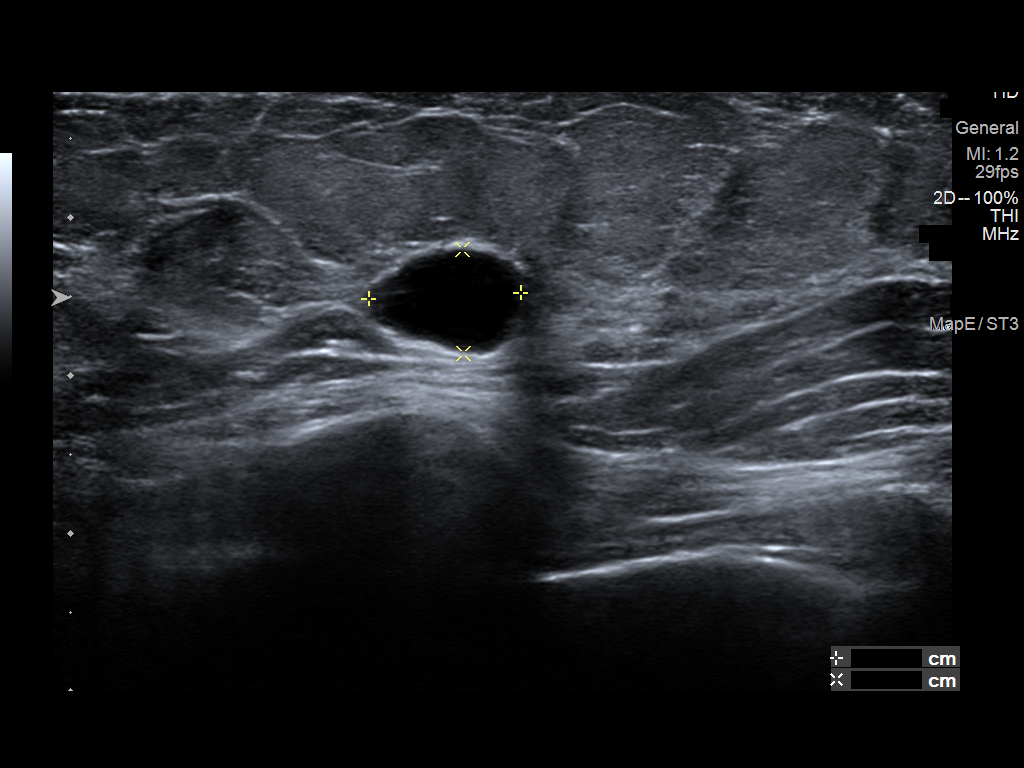
[im 3/6]
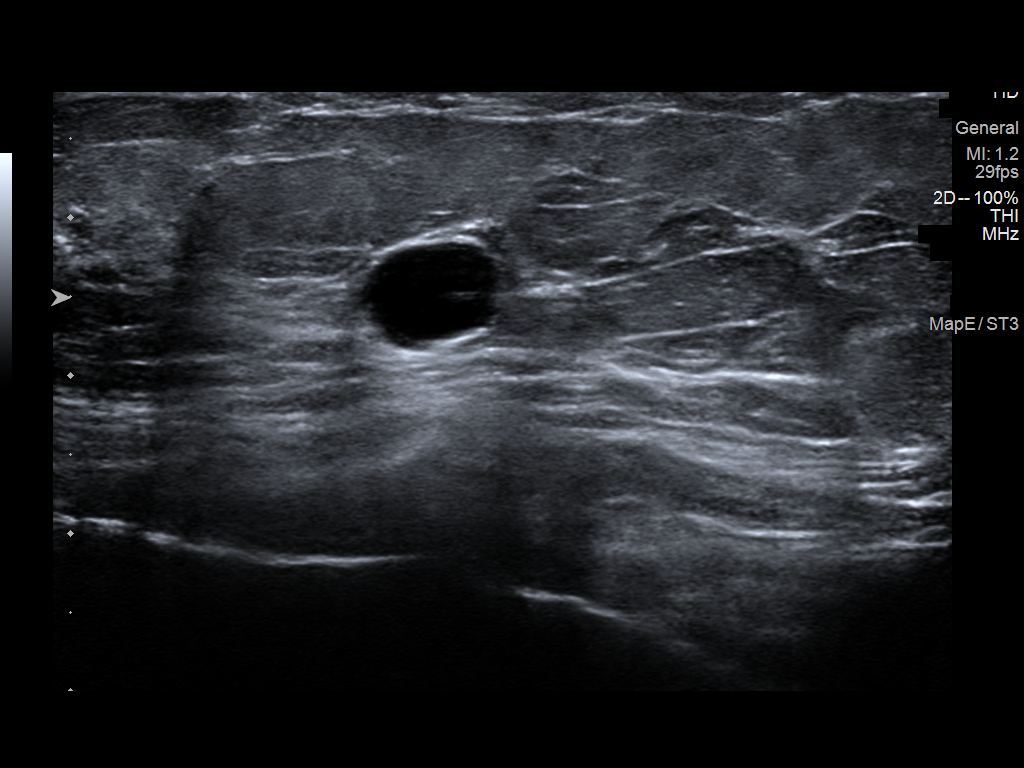
[im 4/6]
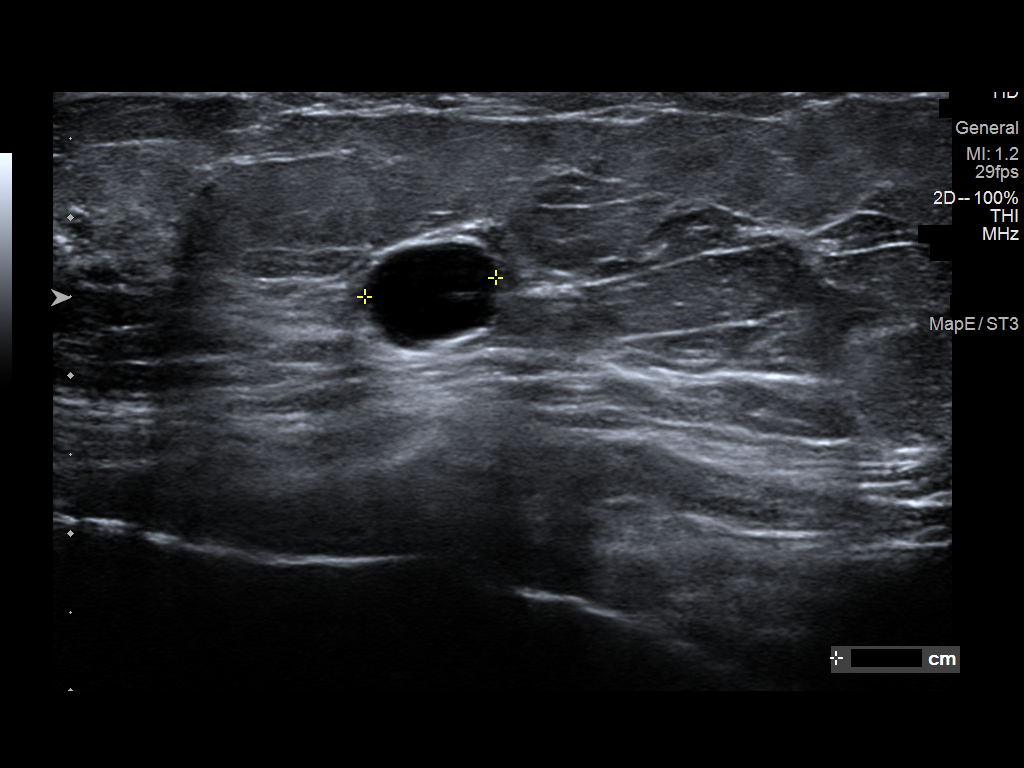
[im 5/6]
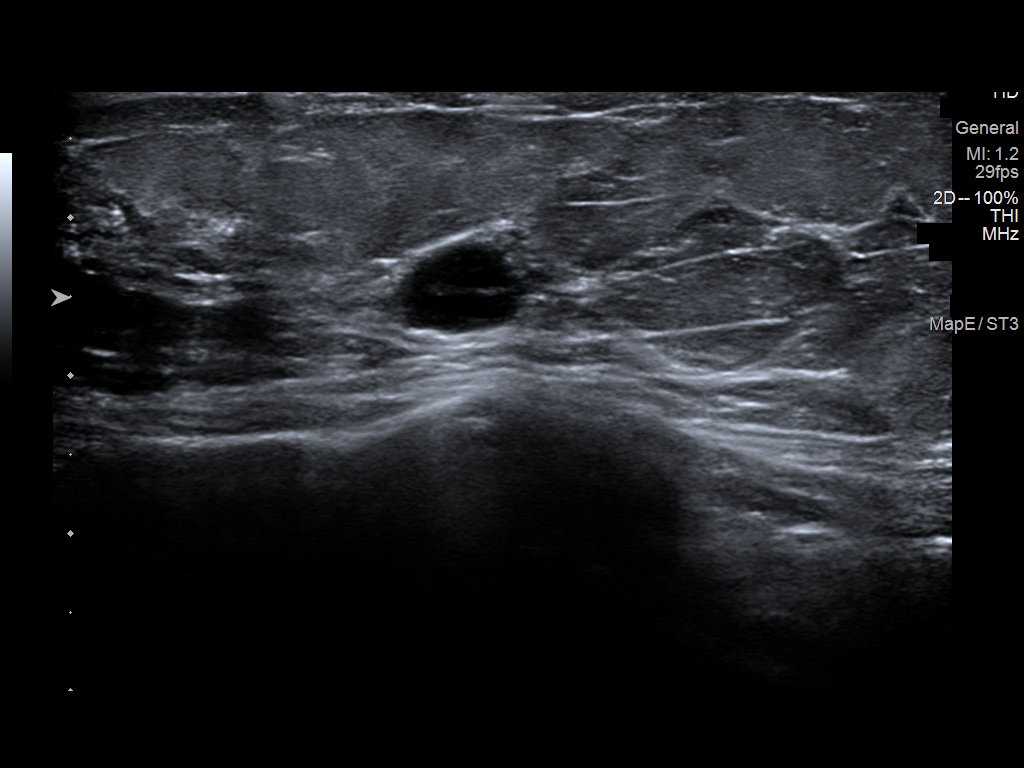
[im 6/6]
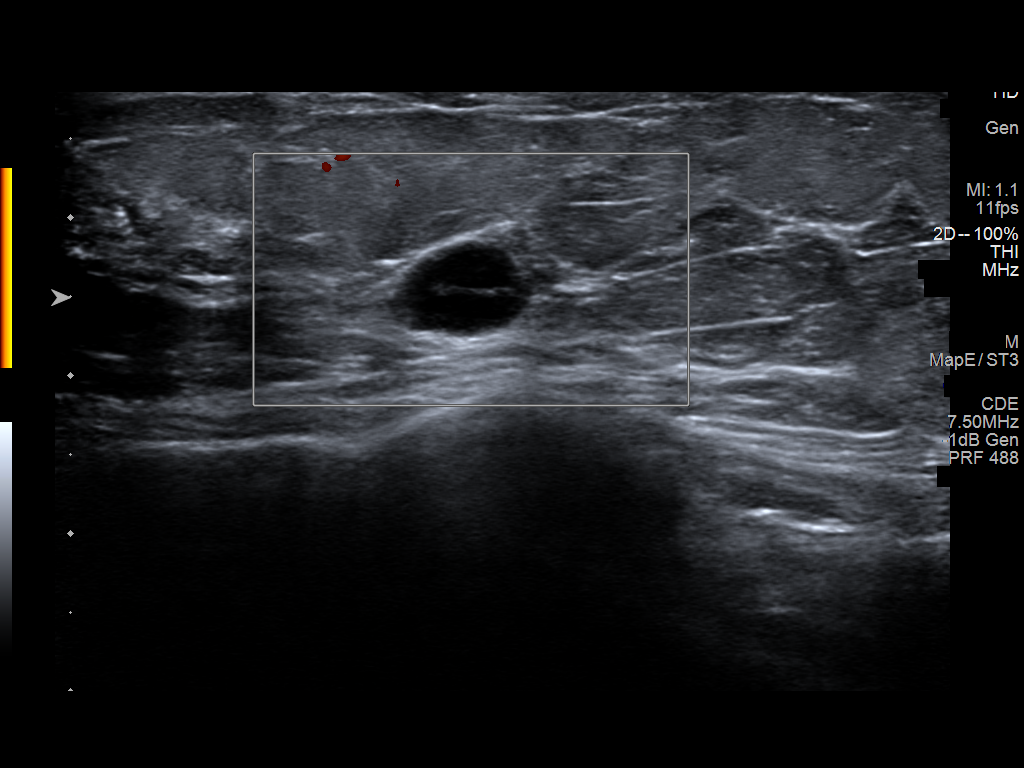

[6 of 6 positions shown; findings below may reference images not displayed]

FINDINGS: Targeted ultrasound is performed, showing an 8 x 10 x 7 mm cyst
right breast 3 o'clock position 1 cm from the nipple. There is a
thin internal septation.
IMPRESSION: Right breast cyst.

RECOMMENDATION:
Screening mammogram in one year.(Code:DI-2-B2R)

I have discussed the findings and recommendations with the patient.
If applicable, a reminder letter will be sent to the patient
regarding the next appointment.

BI-RADS CATEGORY  2: Benign.

## 2021-03-17 DIAGNOSIS — R17 Unspecified jaundice: Secondary | ICD-10-CM | POA: Diagnosis not present

## 2021-03-18 DIAGNOSIS — Z86010 Personal history of colonic polyps: Secondary | ICD-10-CM | POA: Diagnosis not present

## 2021-03-18 DIAGNOSIS — Z1211 Encounter for screening for malignant neoplasm of colon: Secondary | ICD-10-CM | POA: Diagnosis not present

## 2021-03-18 DIAGNOSIS — Z800 Family history of malignant neoplasm of digestive organs: Secondary | ICD-10-CM | POA: Diagnosis not present

## 2021-03-18 DIAGNOSIS — R140 Abdominal distension (gaseous): Secondary | ICD-10-CM | POA: Diagnosis not present

## 2021-04-04 ENCOUNTER — Other Ambulatory Visit: Payer: Self-pay | Admitting: Family Medicine

## 2021-04-05 NOTE — Telephone Encounter (Signed)
LFD 01/01/21 #30 with 2 refills LOV 07/02/20 NOV none

## 2021-04-29 ENCOUNTER — Telehealth: Payer: Medicare Other | Admitting: Physician Assistant

## 2021-04-29 DIAGNOSIS — B9689 Other specified bacterial agents as the cause of diseases classified elsewhere: Secondary | ICD-10-CM

## 2021-04-29 DIAGNOSIS — J019 Acute sinusitis, unspecified: Secondary | ICD-10-CM | POA: Diagnosis not present

## 2021-04-29 MED ORDER — AMOXICILLIN-POT CLAVULANATE 875-125 MG PO TABS: 1.0000 | ORAL_TABLET | Freq: Two times a day (BID) | ORAL | 0 refills | Status: DC

## 2021-04-29 MED ORDER — BENZONATATE 100 MG PO CAPS: 100.0000 mg | ORAL_CAPSULE | Freq: Three times a day (TID) | ORAL | 0 refills | Status: DC | PRN

## 2021-04-29 MED ORDER — LEVOFLOXACIN 500 MG PO TABS: 500.0000 mg | ORAL_TABLET | Freq: Every day | ORAL | 0 refills | Status: DC

## 2021-04-29 NOTE — Addendum Note (Signed)
Addended by: Brunetta Jeans on: 04/29/2021 05:27 PM   Modules accepted: Orders

## 2021-04-29 NOTE — Addendum Note (Signed)
Addended by: Brunetta Jeans on: 04/29/2021 07:26 AM   Modules accepted: Orders

## 2021-04-29 NOTE — Progress Notes (Signed)
I have spent 5 minutes in review of e-visit questionnaire, review and updating patient chart, medical decision making and response to patient.   Deshundra Waller Cody Richel Millspaugh, PA-C    

## 2021-04-29 NOTE — Addendum Note (Signed)
Addended by: Brunetta Jeans on: 04/29/2021 07:21 AM   Modules accepted: Orders

## 2021-04-29 NOTE — Progress Notes (Signed)

## 2021-05-04 ENCOUNTER — Other Ambulatory Visit: Payer: Self-pay | Admitting: Family

## 2021-05-06 NOTE — Telephone Encounter (Signed)
Patient scheduled a medication review with Kathrin Ruddy for tomorrow

## 2021-05-07 ENCOUNTER — Encounter: Payer: Self-pay | Admitting: Registered Nurse

## 2021-05-07 ENCOUNTER — Other Ambulatory Visit: Payer: Self-pay

## 2021-05-07 ENCOUNTER — Telehealth (INDEPENDENT_AMBULATORY_CARE_PROVIDER_SITE_OTHER): Payer: Medicare Other | Admitting: Registered Nurse

## 2021-05-07 VITALS — Wt 143.0 lb

## 2021-05-07 DIAGNOSIS — G4700 Insomnia, unspecified: Secondary | ICD-10-CM

## 2021-05-07 DIAGNOSIS — E785 Hyperlipidemia, unspecified: Secondary | ICD-10-CM | POA: Diagnosis not present

## 2021-05-07 DIAGNOSIS — E663 Overweight: Secondary | ICD-10-CM | POA: Diagnosis not present

## 2021-05-07 DIAGNOSIS — E041 Nontoxic single thyroid nodule: Secondary | ICD-10-CM | POA: Diagnosis not present

## 2021-05-07 MED ORDER — ATORVASTATIN CALCIUM 20 MG PO TABS: 20.0000 mg | ORAL_TABLET | Freq: Every day | ORAL | 1 refills | Status: DC

## 2021-05-07 MED ORDER — LORAZEPAM 0.5 MG PO TABS: 0.5000 mg | ORAL_TABLET | Freq: Every evening | ORAL | 2 refills | Status: DC | PRN

## 2021-05-11 ENCOUNTER — Ambulatory Visit: Payer: Medicare Other | Admitting: Registered Nurse

## 2021-05-19 DIAGNOSIS — Z800 Family history of malignant neoplasm of digestive organs: Secondary | ICD-10-CM | POA: Diagnosis not present

## 2021-05-19 DIAGNOSIS — Z1211 Encounter for screening for malignant neoplasm of colon: Secondary | ICD-10-CM | POA: Diagnosis not present

## 2021-05-19 DIAGNOSIS — Z86010 Personal history of colonic polyps: Secondary | ICD-10-CM | POA: Diagnosis not present

## 2021-05-20 NOTE — Progress Notes (Signed)
Telemedicine Encounter- SOAP NOTE Established Patient  This telephone encounter was conducted with the patient's (or proxy's) verbal consent via audio telecommunications: yes/no: Yes Patient was instructed to have this encounter in a suitably private space; and to only have persons present to whom they give permission to participate. In addition, patient identity was confirmed by use of name plus two identifiers (DOB and address).  I discussed the limitations, risks, security and privacy concerns of performing an evaluation and management service by telephone and the availability of in person appointments. I also discussed with the patient that there may be a patient responsible charge related to this service. The patient expressed understanding and agreed to proceed.  I spent a total of 17 minutes talking with the patient or their proxy.  Patient at home Provider in office  Participants: Kathrin Ruddy, NP and Diona Foley  Chief Complaint  Patient presents with   Hyperlipidemia    Patient states everything is going well. She just needs refills    Subjective   Crystal [name] is a 65 y.o. established patient. Telephone visit today for medication refills.  HPI Anxiety: Using lorazepam 0.'5mg'$  po qhs prn Good effect, no AE She acknowledges risks and alternatives to benzodiazepines. Does not want to change at this time.  Many home stressors, she is concerned changing at this point would do more harm.  HLD: Atorvastatin '20mg'$  po qd with dinner Good effect, no AE Last labs in Oct 2021: Lab Results  Component Value Date   CHOL 165 07/02/2020   HDL 58.50 07/02/2020   LDLCALC 90 07/02/2020   TRIG 81.0 07/02/2020   CHOLHDL 3 07/02/2020  Well controlled.  Reports diet and exercise have been stable since.    Patient Active Problem List   Diagnosis Date Noted   Overweight (BMI 25.0-29.9) 07/02/2020   Vitamin D deficiency 12/14/2016   Physical exam 01/08/2015   Hyperlipidemia  05/07/2014   Hot flashes 05/07/2014   Anxiety state, unspecified 05/07/2014   Insomnia 05/07/2014   Benign thyroid cyst 05/03/2011    Past Medical History:  Diagnosis Date   Abdominal pain    Arthritis    Biliary dyskinesia    Colon polyp    Cyst    right  thyroid   FH: colonic polyps    Fibroadenoma    Hemorrhoids    Hiatal hernia    Hyperlipidemia    IBS (irritable bowel syndrome)    Insomnia    Iron deficiency    Liver cyst    Migraine    Mononucleosis    Neck pain    Thyroid nodule    Wears glasses     Current Outpatient Medications  Medication Sig Dispense Refill   ibuprofen (ADVIL,MOTRIN) 200 MG tablet Take 800 mg by mouth 3 (three) times daily as needed (pain).     Lidocaine 5 % CREA Apply 1 application topically 3 (three) times daily as needed. 30 g 3   Probiotic Product (PROBIOTIC DAILY PO) Take by mouth.     atorvastatin (LIPITOR) 20 MG tablet Take 1 tablet (20 mg total) by mouth daily. 90 tablet 1   LORazepam (ATIVAN) 0.5 MG tablet Take 1 tablet (0.5 mg total) by mouth at bedtime as needed for anxiety. 30 tablet 2   Multiple Vitamins-Minerals (AIRBORNE PO) Take by mouth. (Patient not taking: Reported on 05/07/2021)     No current facility-administered medications for this visit.    No Known Allergies  Social History   Socioeconomic  History   Marital status: Married    Spouse name: Not on file   Number of children: Not on file   Years of education: Not on file   Highest education level: Not on file  Occupational History   Not on file  Tobacco Use   Smoking status: Former    Types: Cigarettes    Quit date: 02/05/1984    Years since quitting: 37.3   Smokeless tobacco: Never   Tobacco comments:    81yr ago quit  Substance and Sexual Activity   Alcohol use: No   Drug use: No   Sexual activity: Not on file  Other Topics Concern   Not on file  Social History Narrative   Not on file   Social Determinants of Health   Financial Resource Strain:  Not on file  Food Insecurity: Not on file  Transportation Needs: Not on file  Physical Activity: Not on file  Stress: Not on file  Social Connections: Not on file  Intimate Partner Violence: Not on file    Review of Systems  Constitutional: Negative.   HENT: Negative.    Eyes: Negative.   Respiratory: Negative.    Cardiovascular: Negative.   Gastrointestinal: Negative.   Genitourinary: Negative.   Musculoskeletal: Negative.   Skin: Negative.   Neurological: Negative.   Endo/Heme/Allergies: Negative.   Psychiatric/Behavioral: Negative.    All other systems reviewed and are negative.  Objective   Vitals as reported by the patient: Today's Vitals   05/07/21 1506  Weight: 143 lb (64.9 kg)    [name] was seen today for hyperlipidemia.  Diagnoses and all orders for this visit:  Hyperlipidemia, unspecified hyperlipidemia type -     atorvastatin (LIPITOR) 20 MG tablet; Take 1 tablet (20 mg total) by mouth daily. -     CBC with Differential/Platelet; Future -     Hemoglobin A1c; Future -     Lipid panel; Future  Overweight (BMI 25.0-29.9) -     CBC with Differential/Platelet; Future -     Hemoglobin A1c; Future -     Comprehensive metabolic panel; Future  Benign thyroid cyst -     TSH; Future  Insomnia, unspecified type -     LORazepam (ATIVAN) 0.5 MG tablet; Take 1 tablet (0.5 mg total) by mouth at bedtime as needed for anxiety.   PLAN Future labs placed. Pt plans to present in coming weeks. Refill meds as above. Follow up with Dr. TBirdie Riddlein 6 mo PDMP consulted, no concerns Patient encouraged to call clinic with any questions, comments, or concerns.   I discussed the assessment and treatment plan with the patient. The patient was provided an opportunity to ask questions and all were answered. The patient agreed with the plan and demonstrated an understanding of the instructions.   The patient was advised to call back or seek an in-person evaluation if the  symptoms worsen or if the condition fails to improve as anticipated.  I provided 17 minutes of non-face-to-face time during this encounter.  RMaximiano Coss NP

## 2021-06-30 DIAGNOSIS — L720 Epidermal cyst: Secondary | ICD-10-CM | POA: Diagnosis not present

## 2021-07-01 ENCOUNTER — Other Ambulatory Visit (INDEPENDENT_AMBULATORY_CARE_PROVIDER_SITE_OTHER): Payer: Medicare Other

## 2021-07-01 ENCOUNTER — Other Ambulatory Visit: Payer: Self-pay

## 2021-07-01 ENCOUNTER — Ambulatory Visit: Payer: Medicare Other | Admitting: Family Medicine

## 2021-07-01 DIAGNOSIS — E663 Overweight: Secondary | ICD-10-CM

## 2021-07-01 DIAGNOSIS — E785 Hyperlipidemia, unspecified: Secondary | ICD-10-CM | POA: Diagnosis not present

## 2021-07-01 DIAGNOSIS — E041 Nontoxic single thyroid nodule: Secondary | ICD-10-CM

## 2021-07-01 LAB — COMPREHENSIVE METABOLIC PANEL
ALT: 12 U/L (ref 0–35)
AST: 13 U/L (ref 0–37)
Albumin: 4.1 g/dL (ref 3.5–5.2)
Alkaline Phosphatase: 69 U/L (ref 39–117)
BUN: 12 mg/dL (ref 6–23)
CO2: 29 mEq/L (ref 19–32)
Calcium: 9.3 mg/dL (ref 8.4–10.5)
Chloride: 105 mEq/L (ref 96–112)
Creatinine, Ser: 0.62 mg/dL (ref 0.40–1.20)
GFR: 92.66 mL/min (ref >60.00)
Glucose, Bld: 85 mg/dL (ref 70–99)
Potassium: 4.3 mEq/L (ref 3.5–5.1)
Sodium: 140 mEq/L (ref 135–145)
Total Bilirubin: 1.6 mg/dL — ABNORMAL HIGH (ref 0.2–1.2)
Total Protein: 6.6 g/dL (ref 6.0–8.3)

## 2021-07-01 LAB — CBC WITH DIFFERENTIAL/PLATELET
Basophils Absolute: 0 10*3/uL (ref 0.0–0.1)
Basophils Relative: 0.4 % (ref 0.0–3.0)
Eosinophils Absolute: 0.1 10*3/uL (ref 0.0–0.7)
Eosinophils Relative: 2.2 % (ref 0.0–5.0)
HCT: 41 % (ref 36.0–46.0)
Hemoglobin: 13.4 g/dL (ref 12.0–15.0)
Lymphocytes Relative: 25.3 % (ref 12.0–46.0)
Lymphs Abs: 1.5 10*3/uL (ref 0.7–4.0)
MCHC: 32.6 g/dL (ref 30.0–36.0)
MCV: 89.9 fl (ref 78.0–100.0)
Monocytes Absolute: 0.4 10*3/uL (ref 0.1–1.0)
Monocytes Relative: 6.8 % (ref 3.0–12.0)
Neutro Abs: 3.9 10*3/uL (ref 1.4–7.7)
Neutrophils Relative %: 65.3 % (ref 43.0–77.0)
Platelets: 265 10*3/uL (ref 150.0–400.0)
RBC: 4.57 Mil/uL (ref 3.87–5.11)
RDW: 13.6 % (ref 11.5–15.5)
WBC: 5.9 10*3/uL (ref 4.0–10.5)

## 2021-07-01 LAB — LIPID PANEL
Cholesterol: 254 mg/dL — ABNORMAL HIGH (ref 0–200)
HDL: 62.2 mg/dL (ref >39.00)
LDL Cholesterol: 174 mg/dL — ABNORMAL HIGH (ref 0–99)
NonHDL: 191.96
Total CHOL/HDL Ratio: 4
Triglycerides: 89 mg/dL (ref 0.0–149.0)
VLDL: 17.8 mg/dL (ref 0.0–40.0)

## 2021-07-01 LAB — TSH: TSH: 1.23 u[IU]/mL (ref 0.35–5.50)

## 2021-07-01 LAB — HEMOGLOBIN A1C: Hgb A1c MFr Bld: 5.7 % (ref 4.6–6.5)

## 2021-07-02 ENCOUNTER — Other Ambulatory Visit: Payer: Self-pay | Admitting: Registered Nurse

## 2021-07-02 ENCOUNTER — Encounter: Payer: Self-pay | Admitting: Registered Nurse

## 2021-07-02 DIAGNOSIS — E782 Mixed hyperlipidemia: Secondary | ICD-10-CM

## 2021-07-02 MED ORDER — ATORVASTATIN CALCIUM 40 MG PO TABS: 40.0000 mg | ORAL_TABLET | Freq: Every day | ORAL | 3 refills | Status: DC

## 2021-07-05 MED ORDER — TRAZODONE HCL 50 MG PO TABS: 25.0000 mg | ORAL_TABLET | Freq: Every evening | ORAL | 3 refills | Status: DC | PRN

## 2021-07-05 MED ORDER — ATORVASTATIN CALCIUM 20 MG PO TABS: 20.0000 mg | ORAL_TABLET | Freq: Every day | ORAL | 1 refills | Status: AC

## 2021-07-05 NOTE — Addendum Note (Signed)
Addended by: Midge Minium on: 07/05/2021 12:43 PM   Modules accepted: Orders

## 2021-07-29 ENCOUNTER — Other Ambulatory Visit: Payer: Self-pay | Admitting: Family Medicine

## 2021-08-06 ENCOUNTER — Other Ambulatory Visit: Payer: Self-pay | Admitting: Registered Nurse

## 2021-08-06 DIAGNOSIS — G4700 Insomnia, unspecified: Secondary | ICD-10-CM

## 2021-08-07 DIAGNOSIS — J320 Chronic maxillary sinusitis: Secondary | ICD-10-CM | POA: Diagnosis not present

## 2021-10-08 DIAGNOSIS — M79672 Pain in left foot: Secondary | ICD-10-CM | POA: Diagnosis not present

## 2021-10-08 DIAGNOSIS — G5762 Lesion of plantar nerve, left lower limb: Secondary | ICD-10-CM | POA: Diagnosis not present

## 2021-10-10 DIAGNOSIS — M5430 Sciatica, unspecified side: Secondary | ICD-10-CM | POA: Insufficient documentation

## 2021-10-21 ENCOUNTER — Ambulatory Visit: Payer: Medicare Other | Admitting: Family Medicine

## 2021-10-22 ENCOUNTER — Telehealth: Payer: Self-pay | Admitting: Family Medicine

## 2021-10-22 ENCOUNTER — Ambulatory Visit (INDEPENDENT_AMBULATORY_CARE_PROVIDER_SITE_OTHER): Payer: Medicare Other | Admitting: Family Medicine

## 2021-10-22 ENCOUNTER — Encounter: Payer: Self-pay | Admitting: Family Medicine

## 2021-10-22 VITALS — BP 112/70 | HR 72 | Temp 97.8°F | Resp 16 | Wt 150.2 lb

## 2021-10-22 DIAGNOSIS — M5430 Sciatica, unspecified side: Secondary | ICD-10-CM

## 2021-10-22 DIAGNOSIS — E785 Hyperlipidemia, unspecified: Secondary | ICD-10-CM | POA: Diagnosis not present

## 2021-10-22 DIAGNOSIS — G4700 Insomnia, unspecified: Secondary | ICD-10-CM

## 2021-10-22 DIAGNOSIS — F411 Generalized anxiety disorder: Secondary | ICD-10-CM | POA: Diagnosis not present

## 2021-10-22 LAB — HEPATIC FUNCTION PANEL
ALT: 17 U/L (ref 0–35)
AST: 14 U/L (ref 0–37)
Albumin: 4.4 g/dL (ref 3.5–5.2)
Alkaline Phosphatase: 76 U/L (ref 39–117)
Bilirubin, Direct: 0.2 mg/dL (ref 0.0–0.3)
Total Bilirubin: 1.5 mg/dL — ABNORMAL HIGH (ref 0.2–1.2)
Total Protein: 6.7 g/dL (ref 6.0–8.3)

## 2021-10-22 LAB — CBC WITH DIFFERENTIAL/PLATELET
Basophils Absolute: 0.1 10*3/uL (ref 0.0–0.1)
Basophils Relative: 0.8 % (ref 0.0–3.0)
Eosinophils Absolute: 0.1 10*3/uL (ref 0.0–0.7)
Eosinophils Relative: 1.2 % (ref 0.0–5.0)
HCT: 39.9 % (ref 36.0–46.0)
Hemoglobin: 13.2 g/dL (ref 12.0–15.0)
Lymphocytes Relative: 22.4 % (ref 12.0–46.0)
Lymphs Abs: 1.7 10*3/uL (ref 0.7–4.0)
MCHC: 33.2 g/dL (ref 30.0–36.0)
MCV: 88.1 fl (ref 78.0–100.0)
Monocytes Absolute: 0.5 10*3/uL (ref 0.1–1.0)
Monocytes Relative: 6.4 % (ref 3.0–12.0)
Neutro Abs: 5.3 10*3/uL (ref 1.4–7.7)
Neutrophils Relative %: 69.2 % (ref 43.0–77.0)
Platelets: 294 10*3/uL (ref 150.0–400.0)
RBC: 4.53 Mil/uL (ref 3.87–5.11)
RDW: 14 % (ref 11.5–15.5)
WBC: 7.7 10*3/uL (ref 4.0–10.5)

## 2021-10-22 LAB — BASIC METABOLIC PANEL
BUN: 14 mg/dL (ref 6–23)
CO2: 31 mEq/L (ref 19–32)
Calcium: 9.5 mg/dL (ref 8.4–10.5)
Chloride: 103 mEq/L (ref 96–112)
Creatinine, Ser: 0.6 mg/dL (ref 0.40–1.20)
GFR: 93.19 mL/min (ref >60.00)
Glucose, Bld: 84 mg/dL (ref 70–99)
Potassium: 4.2 mEq/L (ref 3.5–5.1)
Sodium: 140 mEq/L (ref 135–145)

## 2021-10-22 LAB — LIPID PANEL
Cholesterol: 166 mg/dL (ref 0–200)
HDL: 60 mg/dL (ref >39.00)
LDL Cholesterol: 90 mg/dL (ref 0–99)
NonHDL: 106.26
Total CHOL/HDL Ratio: 3
Triglycerides: 82 mg/dL (ref 0.0–149.0)
VLDL: 16.4 mg/dL (ref 0.0–40.0)

## 2021-10-22 LAB — TSH: TSH: 1.13 u[IU]/mL (ref 0.35–5.50)

## 2021-10-22 MED ORDER — PREDNISONE 10 MG PO TABS: ORAL_TABLET | ORAL | 0 refills | Status: DC

## 2021-10-22 MED ORDER — TIZANIDINE HCL 4 MG PO TABS: 4.0000 mg | ORAL_TABLET | Freq: Three times a day (TID) | ORAL | 0 refills | Status: AC | PRN

## 2021-10-22 MED ORDER — TEMAZEPAM 15 MG PO CAPS: 15.0000 mg | ORAL_CAPSULE | Freq: Every evening | ORAL | 1 refills | Status: DC | PRN

## 2021-10-22 NOTE — Progress Notes (Signed)
° °  Subjective:    Patient ID: Crystal [name], female    DOB: [birth year], 66 y.o.   MRN: [id number]  HPI Hyperlipidemia- chronic problem, currently on Lipitor 20mg  daily.  Pt admits she's not taking regularly.  Denies CP, SOB, abd pain, N/V.  Pt reports on the days she doesn't take the medication she 'feels like a new person'.  Much less body aches.    Anxiety- chronic problem, has been using Lorazepam prn.  She reports she is taking the Lorazepam more for sleep than for anxiety.  Wants to switch to a medication that is more targeted to sleep rather than anxiety.  Admits she's been on Lorazepam 'forever' and weaning off will be very difficult.  Sciatica- 'i'm having a major attack.  I can't put into words how much pain I'm in'.  Sxs started Saturday.  She has hx of similar.  'i'm distraught'.  Has been using ice, then lidocaine gel.  Is trying to walk and do yoga stretches.  Using Advil w/ minimal relief.   Review of Systems For ROS see HPI   This visit occurred during the SARS-CoV-2 public health emergency.  Safety protocols were in place, including screening questions prior to the visit, additional usage of staff PPE, and extensive cleaning of exam room while observing appropriate contact time as indicated for disinfecting solutions.      Objective:   Physical Exam Vitals reviewed.  Constitutional:      Appearance: Normal appearance. She is well-developed. She is not ill-appearing.     Comments: Pacing around the room, unable to sit or even stand still due to back pain  HENT:     Head: Normocephalic and atraumatic.  Eyes:     Conjunctiva/sclera: Conjunctivae normal.     Pupils: Pupils are equal, round, and reactive to light.  Neck:     Thyroid: No thyromegaly.  Cardiovascular:     Rate and Rhythm: Normal rate and regular rhythm.     Pulses: Normal pulses.     Heart sounds: Normal heart sounds. No murmur heard. Pulmonary:     Effort: Pulmonary effort is normal. No respiratory  distress.     Breath sounds: Normal breath sounds.  Abdominal:     General: There is no distension.     Palpations: Abdomen is soft.     Tenderness: There is no abdominal tenderness.  Musculoskeletal:     Cervical back: Normal range of motion and neck supple.     Right lower leg: No edema.     Left lower leg: No edema.  Lymphadenopathy:     Cervical: No cervical adenopathy.  Skin:    General: Skin is warm and dry.  Neurological:     Mental Status: She is alert and oriented to person, place, and time.  Psychiatric:        Behavior: Behavior normal.          Assessment & Plan:

## 2021-10-22 NOTE — Patient Instructions (Signed)
Schedule your complete physical in 6 months We'll notify you of your lab results and make any changes if needed START the Prednisone as directed- take w/ food HOLD on Ibuprofen/Aleve/Motrin while on the Prednisone ADD Tylenol as needed for breakthrough pain USE the Tizanidine as needed for muscle spasm HEAT for pain relief!! STOP the Lorazepam and START the Temazepam nightly for sleep Call with any questions or concerns Hang in there!!  This pain will get better!!! HAPPY EARLY BIRTHDAY!!!

## 2021-10-22 NOTE — Telephone Encounter (Signed)
Pt called in stating that her pharmacy is out of the temazepam. She states [facility] [address] [facility] but since it is a new script it would need to be sent to that pharmacy directly.  [facility] [address] pt would like to pick it up today if possible.  Saw Tabori this morning

## 2021-10-25 ENCOUNTER — Encounter: Payer: Self-pay | Admitting: Family Medicine

## 2021-10-25 MED ORDER — LIDOCAINE 5 % EX CREA: 1.0000 "application " | TOPICAL_CREAM | Freq: Three times a day (TID) | CUTANEOUS | 3 refills | Status: AC | PRN

## 2021-10-25 MED ORDER — TEMAZEPAM 15 MG PO CAPS
15.0000 mg | ORAL_CAPSULE | Freq: Every evening | ORAL | 1 refills | Status: DC | PRN
Start: 2021-10-25 — End: 2021-10-29

## 2021-10-25 NOTE — Telephone Encounter (Signed)
Patient aware.

## 2021-10-25 NOTE — Telephone Encounter (Signed)
Prescription sent to [facility]

## 2021-10-26 ENCOUNTER — Encounter: Payer: Self-pay | Admitting: Family Medicine

## 2021-10-26 NOTE — Assessment & Plan Note (Signed)
Chronic problem for pt.  Has been using Lorazepam nightly.  She would like to wean off Lorazepam and use a medication strictly for sleep and not anxiety.  Discussed the option of Temazepam which could be a 1:1 switch and not precipitate withdrawal.  Pt expressed understanding and is in agreement w/ plan.

## 2021-10-26 NOTE — Assessment & Plan Note (Signed)
Deteriorated.  Pt in the midst of severe episode.  Will start Prednisone taper and Tizanidine prn.  Pt expressed understanding and is in agreement w/ plan.

## 2021-10-26 NOTE — Assessment & Plan Note (Signed)
Chronic problem.  Currently prescribed Lipitor 20mg  daily but admits to not taking it regularly b/c she feels much better off the medication.  Will check labs and see if medication is needed, and if yes, will start Zetia rather than a statin due to myalgias.  Pt expressed understanding and is in agreement w/ plan.

## 2021-10-26 NOTE — Telephone Encounter (Signed)
Pt sent message stating she is not comfortable taking temazepam due to potential side effects and would like you to consider Ramelton.   Please advise

## 2021-10-26 NOTE — Assessment & Plan Note (Signed)
Pt reports her anxiety and mood are in a very good place right now and that she's using the Lorazepam more for sleep than for anxiety.  She is interested in switching to a sleep medication rather than an anxiety medication and weaning off the Lorazepam if possible.  Will try a 1:1 switch w/ Temazepam and see if this helps her sleep w/o precipitating benzo withdrawal.  Pt expressed understanding and is in agreement w/ plan.

## 2021-10-29 MED ORDER — RAMELTEON 8 MG PO TABS: 8.0000 mg | ORAL_TABLET | Freq: Every day | ORAL | 3 refills | Status: AC

## 2021-11-07 ENCOUNTER — Other Ambulatory Visit: Payer: Self-pay | Admitting: Registered Nurse

## 2021-11-07 DIAGNOSIS — G4700 Insomnia, unspecified: Secondary | ICD-10-CM

## 2021-11-08 NOTE — Telephone Encounter (Signed)
Pt called in stating that she is only taking the Lorazepam she isn't taking the Ramelteon  ? ?

## 2021-11-08 NOTE — Telephone Encounter (Signed)
Patient is requesting a refill of the following medications: ?Requested Prescriptions  ? ?Pending Prescriptions Disp Refills  ? LORazepam (ATIVAN) 0.5 MG tablet [Pharmacy Med Name: LORAZEPAM 0.5 MG TABLET] 30 tablet 2  ?  Sig: TAKE 1 TABLET BY MOUTH AT BEDTIME AS NEEDED FOR ANXIETY  ? ? ?Date of patient request: 11/07/2021 ?Last office visit: 10/22/2021 ?Date of last refill: 08/06/2021 ?Last refill amount: 30 tablets 2 refills  ?Follow up time period per chart: 02/11/2022 ? ?

## 2021-11-22 ENCOUNTER — Telehealth: Payer: Self-pay | Admitting: Internal Medicine

## 2021-11-22 NOTE — Telephone Encounter (Signed)
Good afternoon Dr. Hilarie Fredrickson, ? ?Patient called requesting to schedule colonoscopy with you.  Her last colon was in 2022.  Records are scanned in Epic.  Can you please review and advise on scheduling?  Thank you. ?

## 2021-11-24 NOTE — Telephone Encounter (Signed)
Request received to transfer GI care from outside practice to [facility].  We appreciate the interest in our practice, however at this time due to high demand from patients without established GI providers we cannot accommodate this transfer.  Ability to accommodate future transfer requests may change over time and the patient can contact us again in 6-12 months if still interested in being seen at [facility].      °

## 2021-12-07 DIAGNOSIS — Z1211 Encounter for screening for malignant neoplasm of colon: Secondary | ICD-10-CM | POA: Diagnosis not present

## 2022-02-05 ENCOUNTER — Other Ambulatory Visit: Payer: Self-pay | Admitting: Family Medicine

## 2022-02-05 DIAGNOSIS — G4700 Insomnia, unspecified: Secondary | ICD-10-CM

## 2022-02-07 NOTE — Telephone Encounter (Signed)
Patient is requesting a refill of the following medications: Requested Prescriptions   Pending Prescriptions Disp Refills   LORazepam (ATIVAN) 0.5 MG tablet [Pharmacy Med Name: LORAZEPAM 0.5 MG TABLET] 30 tablet     Sig: TAKE 1 TABLET BY MOUTH AT BEDTIME AS NEEDED FOR ANXIETY    Date of patient request: 02/05/2022 Last office visit: 10/22/2021 Date of last refill: 11/08/2021 Last refill amount: 30 tablets 2 refills  Follow up time period per chart: 04/21/2022

## 2022-02-08 NOTE — Telephone Encounter (Signed)
PT stating that she is out of her medication lorazepam 0.5 mg. She can't go another night with out it. [facility] [address] [facility]

## 2022-02-08 NOTE — Telephone Encounter (Signed)
Spoke w/ pt and advised that the medication was sent in by Drema Halon since DR Birdie Riddle is out of office today .

## 2022-02-10 DIAGNOSIS — K5730 Diverticulosis of large intestine without perforation or abscess without bleeding: Secondary | ICD-10-CM | POA: Diagnosis not present

## 2022-02-10 DIAGNOSIS — K644 Residual hemorrhoidal skin tags: Secondary | ICD-10-CM | POA: Diagnosis not present

## 2022-02-10 DIAGNOSIS — Z800 Family history of malignant neoplasm of digestive organs: Secondary | ICD-10-CM | POA: Diagnosis not present

## 2022-02-10 DIAGNOSIS — Z1211 Encounter for screening for malignant neoplasm of colon: Secondary | ICD-10-CM | POA: Diagnosis not present

## 2022-02-11 DIAGNOSIS — Z780 Asymptomatic menopausal state: Secondary | ICD-10-CM | POA: Diagnosis not present

## 2022-02-11 DIAGNOSIS — Z1231 Encounter for screening mammogram for malignant neoplasm of breast: Secondary | ICD-10-CM | POA: Diagnosis not present

## 2022-02-11 DIAGNOSIS — Z0000 Encounter for general adult medical examination without abnormal findings: Secondary | ICD-10-CM | POA: Diagnosis not present

## 2022-02-11 DIAGNOSIS — G4700 Insomnia, unspecified: Secondary | ICD-10-CM | POA: Diagnosis not present

## 2022-02-11 DIAGNOSIS — R739 Hyperglycemia, unspecified: Secondary | ICD-10-CM | POA: Diagnosis not present

## 2022-02-11 DIAGNOSIS — R61 Generalized hyperhidrosis: Secondary | ICD-10-CM | POA: Diagnosis not present

## 2022-02-11 DIAGNOSIS — R6889 Other general symptoms and signs: Secondary | ICD-10-CM | POA: Diagnosis not present

## 2022-02-11 DIAGNOSIS — E785 Hyperlipidemia, unspecified: Secondary | ICD-10-CM | POA: Diagnosis not present

## 2022-02-11 DIAGNOSIS — K5904 Chronic idiopathic constipation: Secondary | ICD-10-CM | POA: Diagnosis not present

## 2022-02-28 DIAGNOSIS — Z1231 Encounter for screening mammogram for malignant neoplasm of breast: Secondary | ICD-10-CM | POA: Diagnosis not present

## 2022-02-28 DIAGNOSIS — I119 Hypertensive heart disease without heart failure: Secondary | ICD-10-CM | POA: Diagnosis not present

## 2022-02-28 DIAGNOSIS — Z780 Asymptomatic menopausal state: Secondary | ICD-10-CM | POA: Diagnosis not present

## 2022-02-28 DIAGNOSIS — E785 Hyperlipidemia, unspecified: Secondary | ICD-10-CM | POA: Diagnosis not present

## 2022-03-07 ENCOUNTER — Other Ambulatory Visit: Payer: Self-pay | Admitting: Registered Nurse

## 2022-03-07 DIAGNOSIS — G4700 Insomnia, unspecified: Secondary | ICD-10-CM

## 2022-03-21 ENCOUNTER — Telehealth: Payer: Medicare Other | Admitting: Family Medicine

## 2022-03-21 NOTE — Telephone Encounter (Signed)
I called patient to schedule AWV.  Patient said she has switched her PCP to [facility] in [place]. Please remove PCP.

## 2022-04-21 ENCOUNTER — Encounter: Payer: Medicare Other | Admitting: Family Medicine

## 2022-12-09 ENCOUNTER — Ambulatory Visit
Admission: EM | Admit: 2022-12-09 | Discharge: 2022-12-09 | Disposition: A | Discharge status: Home / Self Care | Payer: Medicare HMO | Attending: Nurse Practitioner | Admitting: Nurse Practitioner

## 2022-12-09 DIAGNOSIS — M79675 Pain in left toe(s): Secondary | ICD-10-CM | POA: Diagnosis not present

## 2022-12-09 DIAGNOSIS — M7989 Other specified soft tissue disorders: Secondary | ICD-10-CM | POA: Diagnosis not present

## 2022-12-09 MED ORDER — IBUPROFEN 600 MG PO TABS: 600.0000 mg | ORAL_TABLET | Freq: Four times a day (QID) | ORAL | 0 refills | Status: AC | PRN

## 2022-12-09 MED ORDER — COLCHICINE 0.6 MG PO TABS: 0.6000 mg | ORAL_TABLET | Freq: Every day | ORAL | 0 refills | Status: AC

## 2022-12-09 MED ORDER — CEPHALEXIN 500 MG PO CAPS: 500.0000 mg | ORAL_CAPSULE | Freq: Three times a day (TID) | ORAL | 0 refills | Status: AC

## 2022-12-09 NOTE — ED Triage Notes (Signed)
Patient c/o left foot pain/ swelling that began today.  Home interventions: none

## 2022-12-09 NOTE — ED Provider Notes (Signed)
[facility]    CSN: [id number] Arrival date & time: 12/09/22  1919      History   Chief Complaint Chief Complaint  Patient presents with   Foot Pain    HPI Crystal [name] is a 67 y.o. female presents for evaluation of toe pain.  Patient reports yesterday she got a pedicure and had no issues with it.  She has been to this location many times.  Today she awoke with some swelling and pain to the left first MTP joint that extends to the nail.  Denies any erythema.  Does have pain with weightbearing and movement.  No injury to the area.  Denies history of gout but states her mother had gout and this seems to be similar.  No recent change in diet.  No history of MRSA or cellulitis.  She has not used any OTC medications for symptoms.  No other concerns at this time.   Foot Pain    Past Medical History:  Diagnosis Date   Abdominal pain    Arthritis    Biliary dyskinesia    Colon polyp    Cyst    right  thyroid   FH: colonic polyps    Fibroadenoma    Hemorrhoids    Hiatal hernia    Hyperlipidemia    IBS (irritable bowel syndrome)    Insomnia    Iron deficiency    Liver cyst    Migraine    Mononucleosis    Neck pain    Thyroid nodule    Wears glasses     Patient Active Problem List   Diagnosis Date Noted   Sciatic leg pain 10/10/2021   Overweight (BMI 25.0-29.9) 07/02/2020   Vitamin D deficiency 12/14/2016   Physical exam 01/08/2015   Hyperlipidemia 05/07/2014   Hot flashes 05/07/2014   Anxiety state 05/07/2014   Insomnia 05/07/2014   Benign thyroid cyst 05/03/2011    Past Surgical History:  Procedure Laterality Date   CESAREAN SECTION     x2   CHOLECYSTECTOMY  12/01/11   ENDOMETRIAL ABLATION  2010   fibroadenoma removal  1976   left     OB History   No obstetric history on file.      Home Medications    Prior to Admission medications   Medication Sig Start Date End Date Taking? Authorizing Provider  cephALEXin (KEFLEX) 500 MG capsule  Take 1 capsule (500 mg total) by mouth 3 (three) times daily for 7 days. 12/09/22 12/16/22 Yes Radford PaxMayer, Jodi R, NP  colchicine 0.6 MG tablet Take 1 tablet (0.6 mg total) by mouth daily for 3 doses. Take 2 tablets (1.2 mg) once and then 1 hour later take the last tablet (0.6 mg) 12/09/22 12/12/22 Yes Radford PaxMayer, Jodi R, NP  ibuprofen (ADVIL) 600 MG tablet Take 1 tablet (600 mg total) by mouth every 6 (six) hours as needed. 12/09/22  Yes Radford PaxMayer, Jodi R, NP  atorvastatin (LIPITOR) 20 MG tablet Take 1 tablet (20 mg total) by mouth daily. Patient taking differently: Take 20 mg by mouth daily. Not taking consistently 07/05/21   Sheliah Hatchabori, Katherine E, MD  Lidocaine 5 % CREA Apply 1 application topically 3 (three) times daily as needed. 10/25/21   Sheliah Hatchabori, Katherine E, MD  LORazepam (ATIVAN) 0.5 MG tablet TAKE 1 TABLET BY MOUTH AT BEDTIME AS NEEDED FOR ANXIETY 03/09/22   Sheliah Hatchabori, Katherine E, MD  Multiple Vitamins-Minerals (AIRBORNE PO) Take by mouth. Patient not taking: Reported on 05/07/2021    [provider]  Probiotic Product (PROBIOTIC DAILY PO) Take by mouth.    [provider]  ramelteon (ROZEREM) 8 MG tablet Take 1 tablet (8 mg total) by mouth at bedtime. 10/29/21   Sheliah Hatch, MD  tiZANidine (ZANAFLEX) 4 MG tablet Take 1 tablet (4 mg total) by mouth every 8 (eight) hours as needed for muscle spasms. 10/22/21   Sheliah Hatch, MD    Family History Family History  Problem Relation Age of Onset   Hypertension Mother    Kidney disease Mother    Hypertension Father    Cancer Father        colon   Diabetes Father    Cancer Sister        Breast     Social History Social History   Tobacco Use   Smoking status: Former    Types: Cigarettes    Quit date: 02/05/1984    Years since quitting: 38.8   Smokeless tobacco: Never   Tobacco comments:    48yrs ago quit  Substance Use Topics   Alcohol use: No   Drug use: No     Allergies   Patient has no known allergies.   Review of  Systems Review of Systems  Musculoskeletal:        Left great toe pain     Physical Exam Triage Vital Signs ED Triage Vitals  Enc Vitals Group     BP 12/09/22 1937 131/82     Pulse Rate 12/09/22 1937 77     Resp 12/09/22 1937 16     Temp 12/09/22 1937 98 F (36.7 C)     Temp Source 12/09/22 1937 Oral     SpO2 12/09/22 1937 98 %     Weight --      Height --      Head Circumference --      Peak Flow --      Pain Score 12/09/22 1936 10     Pain Loc --      Pain Edu? --      Excl. in GC? --    No data found.  Updated Vital Signs BP 131/82 (BP Location: Right Arm)   Pulse 77   Temp 98 F (36.7 C) (Oral)   Resp 16   SpO2 98%   Visual Acuity Right Eye Distance:   Left Eye Distance:   Bilateral Distance:    Right Eye Near:   Left Eye Near:    Bilateral Near:     Physical Exam Vitals and nursing note reviewed.  Constitutional:      Appearance: Normal appearance.  HENT:     Head: Normocephalic and atraumatic.  Eyes:     Pupils: Pupils are equal, round, and reactive to light.  Cardiovascular:     Rate and Rhythm: Normal rate.  Pulmonary:     Effort: Pulmonary effort is normal.  Musculoskeletal:       Feet:  Feet:     Comments: There is swelling without erythema to the left first MCP joint that extends to the mid toe.  Area is very tender to palpation.  No warmth.  Cap refill +2.  Reduced range of motion due to pain.  No deformity. Skin:    General: Skin is warm and dry.  Neurological:     General: No focal deficit present.     Mental Status: She is alert and oriented to person, place, and time.  Psychiatric:        Mood  and Affect: Mood normal.        Behavior: Behavior normal.      UC Treatments / Results  Labs (all labs ordered are listed, but only abnormal results are displayed) Labs Reviewed - No data to display  EKG   Radiology No results found.  Procedures Procedures (including critical care time)  Medications Ordered in  UC Medications - No data to display  Initial Impression / Assessment and Plan / UC Course  I have reviewed the triage vital signs and the nursing notes.  Pertinent labs & imaging results that were available during my care of the patient were reviewed by me and considered in my medical decision making (see chart for details).     Reviewed exam and symptoms with patient.  Discussed arthritis versus gout versus developing paronychia.  Patient states she has arthritis and does not feel this is similar.  Discussed with patient she would like to be treated for both gout and possible infection.  Start colchicine today and may take ibuprofen as needed.  Provisional prescription for Keflex provided if she does not have any improvement in symptoms after 24 to 48 hours or they worsen she can start Keflex as prescribed Follow-up with PCP if symptoms do not improve ER precautions reviewed and patient verbalized understanding Final Clinical Impressions(s) / UC Diagnoses   Final diagnoses:  Pain and swelling of toe of left foot     Discharge Instructions      Take colchicine today.  You may use ibuprofen as needed for pain If you have no improvement by this time tomorrow you can start Keflex 3 times a day for 7 days to treat possible infection Please follow-up with your PCP if your symptoms do not improve Please go to emergency room if you have any worsening symptoms    ED Prescriptions     Medication Sig Dispense Auth. Provider   colchicine 0.6 MG tablet Take 1 tablet (0.6 mg total) by mouth daily for 3 doses. Take 2 tablets (1.2 mg) once and then 1 hour later take the last tablet (0.6 mg) 3 tablet Radford PaxMayer, Jodi R, NP   ibuprofen (ADVIL) 600 MG tablet Take 1 tablet (600 mg total) by mouth every 6 (six) hours as needed. 15 tablet Radford PaxMayer, Jodi R, NP   cephALEXin (KEFLEX) 500 MG capsule Take 1 capsule (500 mg total) by mouth 3 (three) times daily for 7 days. 21 capsule Radford PaxMayer, Jodi R, NP      PDMP  not reviewed this encounter.   Radford PaxMayer, Jodi R, NP 12/09/22 1958

## 2022-12-09 NOTE — Discharge Instructions (Addendum)
Take colchicine today.  You may use ibuprofen as needed for pain If you have no improvement by this time tomorrow you can start Keflex 3 times a day for 7 days to treat possible infection Please follow-up with your PCP if your symptoms do not improve Please go to emergency room if you have any worsening symptoms

## 2022-12-23 ENCOUNTER — Other Ambulatory Visit: Payer: Self-pay | Admitting: Family Medicine

## 2022-12-23 DIAGNOSIS — G4700 Insomnia, unspecified: Secondary | ICD-10-CM

## 2024-07-29 ENCOUNTER — Other Ambulatory Visit: Payer: Self-pay | Admitting: Obstetrics and Gynecology

## 2024-07-29 ENCOUNTER — Ambulatory Visit
Admission: RE | Admit: 2024-07-29 | Discharge: 2024-07-29 | Disposition: A | Source: Ambulatory Visit | Attending: Obstetrics and Gynecology | Admitting: Obstetrics and Gynecology

## 2024-07-29 DIAGNOSIS — M546 Pain in thoracic spine: Secondary | ICD-10-CM
